# Patient Record
Sex: Male | Born: 1945 | Race: White | Hispanic: No | Marital: Married
Health system: Midwestern US, Academic
[De-identification: ages and names within clinical notes are randomized; demographics above are authoritative.]

## PROBLEM LIST (undated history)

## (undated) DIAGNOSIS — I10 Essential (primary) hypertension: Secondary | ICD-10-CM

## (undated) DIAGNOSIS — E785 Hyperlipidemia, unspecified: Secondary | ICD-10-CM

## (undated) DIAGNOSIS — C801 Malignant (primary) neoplasm, unspecified: Secondary | ICD-10-CM

## (undated) DIAGNOSIS — I255 Ischemic cardiomyopathy: Secondary | ICD-10-CM

## (undated) DIAGNOSIS — I2510 Atherosclerotic heart disease of native coronary artery without angina pectoris: Secondary | ICD-10-CM

## (undated) HISTORY — DX: Atherosclerotic heart disease of native coronary artery without angina pectoris: I25.10

## (undated) HISTORY — DX: Ischemic cardiomyopathy: I25.5

---

## 2014-04-09 ENCOUNTER — Ambulatory Visit: Admit: 2014-04-09 | Discharge: 2014-04-09 | Payer: PRIVATE HEALTH INSURANCE | Attending: Radiation Oncology

## 2014-04-09 DIAGNOSIS — C099 Malignant neoplasm of tonsil, unspecified: Secondary | ICD-10-CM

## 2014-04-09 NOTE — Unmapped (Signed)
Chief Complaint   Patient presents with   ??? 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  Patient states that in late July he developed right cervical lymphadenopathy.  His primary care physician ordered a CT neck without contrast that showed an enlarged right level II neck node measuring 2.7 cm in size that was thought to be infectious.  However, the patient continued to have right sided swelling and was referred to Dr. Tennis Ship, ENT.  He underwent a biopsy in the office of his right tonsil that revealed poorly differentiated squamous cell carcinoma, p16+.  He then underwent PET CT imaging that showed an FDG avid 2x3 cm right level II lymph node, 4 mm right level III lymph node with SUV of 2.4, subcentimeter left level II lymph nodes with max SUV of 2.4 and a 1.6 cm right tonsillar mass with SUV of 11.9 and asymmetric uptake in the left tonsil.  He was referred to Radiation Oncology and ENT Surgeon to discuss treatment options.  He is here today for a second opinion.  He is overall doing well and feels fine without any complaints.  He denies dysphagia, odynophagia, sore throat, weight loss, otalgia, choking, cough, SOB, nausea, vomiting, fever and chills.      Review of Systems   All other systems reviewed and are negative.      Allergies  Review of patient's allergies indicates no known allergies.    Medications  Outpatient Encounter Prescriptions as of 04/09/2014   Medication Sig Dispense Refill   ??? atorvastatin (LIPITOR) 20 MG tablet   0     No facility-administered encounter medications on file as of 04/09/2014.        Histories  He has a past medical history of Cancer.    He has no past surgical history on file.    His family history is not  on file.    He reports that he has never smoked. He does not have any smokeless tobacco history on file. He reports that he drinks alcohol. He reports that he does not use illicit drugs.    Resp. rate 18, height 6' (1.829 m), weight 220 lb (99.791 kg).  Physical Exam   Vitals reviewed.  Constitutional: He is oriented to person, place, and time. He appears well-developed and well-nourished.   HENT:   Head: Normocephalic and atraumatic.   Right Ear: External ear normal.   Left Ear: External ear normal.   Nose: Nose normal.   Mouth/Throat: Oropharynx is clear and moist. No oropharyngeal exudate.   Adentulous, no concerning lesions within OC.  Examination of the oropharynx reveals an ulcerative lesion on the right tonsil that appears to be confined to the tonsil without palpable evidence of invasion onto the base of tongue.   Eyes: Conjunctivae and EOM are normal. Right eye exhibits no discharge. Left eye exhibits no discharge. No scleral icterus.   Neck: Normal range of motion. Neck supple. No rigidity. No edema, no erythema and normal range of motion present. No thyromegaly present.   Palpable, mobile non-fixed right level II enlarged lymph node    Cardiovascular: Normal rate, regular rhythm and intact distal pulses.  Exam reveals no gallop and  no friction rub.    No murmur heard.  Pulmonary/Chest: Effort normal and breath sounds normal. No respiratory distress.   Abdominal: Soft. He exhibits no distension.   Musculoskeletal: Normal range of motion.   Lymphadenopathy:        Head (right side): No submental, no submandibular, no tonsillar, no preauricular, no posterior auricular and no occipital adenopathy present.        Head (left side): No submental, no submandibular, no tonsillar, no preauricular, no posterior auricular and no occipital adenopathy present.     He has cervical adenopathy.        Right cervical: Superficial cervical adenopathy present. No deep cervical and no posterior cervical adenopathy present.        Left cervical: No superficial cervical, no deep cervical and no posterior cervical adenopathy present.     He has no axillary adenopathy.        Right axillary: No pectoral and no lateral adenopathy present.        Left axillary: No pectoral and no lateral adenopathy present.       Right: No supraclavicular adenopathy present.        Left: No supraclavicular adenopathy present.   Neurological: He is alert and oriented to person, place, and time. No cranial nerve deficit.   Skin: Skin is warm and dry. No rash noted. No erythema. No pallor.   Psychiatric: He has a normal mood and affect. His behavior is normal. Judgment and thought content normal.         Diagnostic Studies Reviewed  I personally reviewed the CT neck without contrast from 11/2013 and PET CT from 03/2014    PET CT 03/2014  FDG avid 2x3 cm right level II lymph node, 4 mm right level III lymph node with SUV of 2.4, subcentimeter left level II lymph nodes with max SUV of 2.4 and a 1.6 cm right tonsillar mass with SUV of 11.9 and asymmetric uptake in the left tonsil.     Review of Lab Results  No results found for: WBC, RBC, HGB, HCT, MCV, MCH, MCHC, RDW, PLT, MPV       Assessment  Mr. 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  He is here today for a second opinion regarding treatment of his locally advanced H&N cancer.  We discussed with the patient that his work up incomplete as he needs a quad scope with biopsy to further evaluate the tonsil and surrounding areas.  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  We dicussed these findings with the patient and his diagnosis and staging.  We did discuss definitive treatment with radiation vs chemoRT including the  logistics or radiation treatment planning and delivery and potential short and long term side effects of treatments.  We also discussed definitive surgery, however there is a good likelihood that we would require post-op RT.                   Plan  Pending further work-up, we will plan to see the patient back if he wishes to be treated here.  Patient encouraged to call with questions and/or concerns.          Attending Addendum  I have seen and examined the patient, reviewed pertinent imaging and discussed the plan with my resident. Mr Santistevan has an intermediate stage tonsil cancer. 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  I agree with my resident's assessment and plan.  Armen Pickup, MD    Clinical Stage: T_2 N1__ M0__    Pathologic Stage: T___ N___ M___    Performance Status: (0) Fully active, able to carry on all pre-disease performance without restriction    Feeding tube: No       % of nutritional support: (<50%  Or  >50%)

## 2014-04-11 NOTE — Unmapped (Signed)
Has decided to start treatment. PCP ordered CT scan (not scheduled yet). Told him to bring the disc and report after he has it done.

## 2014-04-17 ENCOUNTER — Ambulatory Visit: Admit: 2014-04-17 | Discharge: 2014-04-17 | Payer: PRIVATE HEALTH INSURANCE

## 2014-04-26 ENCOUNTER — Inpatient Hospital Stay: Admit: 2014-04-26

## 2014-04-26 DIAGNOSIS — C099 Malignant neoplasm of tonsil, unspecified: Secondary | ICD-10-CM

## 2014-05-01 NOTE — Unmapped (Deleted)
Chief Complaint   Patient presents with   ??? 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  Patient states that in late July he developed right cervical lymphadenopathy.  His primary care physician ordered a CT neck without contrast that showed an enlarged right level II neck node measuring 2.7 cm in size that was thought to be infectious.  However, the patient continued to have right sided swelling and was referred to Dr. Horne, ENT.  He underwent a biopsy in the office of his right tonsil that revealed poorly differentiated squamous cell carcinoma, p16+.  He then underwent PET CT imaging that showed an FDG avid 2x3 cm right level II lymph node, 4 mm right level III lymph node with SUV of 2.4, subcentimeter left level II lymph nodes with max SUV of 2.4 and a 1.6 cm right tonsillar mass with SUV of 11.9 and asymmetric uptake in the left tonsil.  He was referred to Radiation Oncology and ENT Surgeon to discuss treatment options.  He is here today for a second opinion.  He is overall doing well and feels fine without any complaints.  He denies dysphagia, odynophagia, sore throat, weight loss, otalgia, choking, cough, SOB, nausea, vomiting, fever and chills.      Review of Systems   All other systems reviewed and are negative.      Allergies  Review of patient's allergies indicates no known allergies.    Medications  Outpatient Encounter Prescriptions as of 04/09/2014   Medication Sig Dispense Refill   ??? atorvastatin (LIPITOR) 20 MG tablet   0     No facility-administered encounter medications on file as of 04/09/2014.        Histories  He has a past medical history of Cancer.    He has no past surgical history on file.    His family history is not  on file.    He reports that he has never smoked. He does not have any smokeless tobacco history on file. He reports that he drinks alcohol. He reports that he does not use illicit drugs.    Resp. rate 18, height 6' (1.829 m), weight 220 lb (99.791 kg).  Physical Exam   Vitals reviewed.  Constitutional: He is oriented to person, place, and time. He appears well-developed and well-nourished.   HENT:   Head: Normocephalic and atraumatic.   Right Ear: External ear normal.   Left Ear: External ear normal.   Nose: Nose normal.   Mouth/Throat: Oropharynx is clear and moist. No oropharyngeal exudate.   Adentulous, no concerning lesions within OC.  Examination of the oropharynx reveals an ulcerative lesion on the right tonsil that appears to be confined to the tonsil without palpable evidence of invasion onto the base of tongue.   Eyes: Conjunctivae and EOM are normal. Right eye exhibits no discharge. Left eye exhibits no discharge. No scleral icterus.   Neck: Normal range of motion. Neck supple. No rigidity. No edema, no erythema and normal range of motion present. No thyromegaly present.   Palpable, mobile non-fixed right level II enlarged lymph node    Cardiovascular: Normal rate, regular rhythm and intact distal pulses.  Exam reveals no gallop and   no friction rub.    No murmur heard.  Pulmonary/Chest: Effort normal and breath sounds normal. No respiratory distress.   Abdominal: Soft. He exhibits no distension.   Musculoskeletal: Normal range of motion.   Lymphadenopathy:        Head (right side): No submental, no submandibular, no tonsillar, no preauricular, no posterior auricular and no occipital adenopathy present.        Head (left side): No submental, no submandibular, no tonsillar, no preauricular, no posterior auricular and no occipital adenopathy present.     He has cervical adenopathy.        Right cervical: Superficial cervical adenopathy present. No deep cervical and no posterior cervical adenopathy present.        Left cervical: No superficial cervical, no deep cervical and no posterior cervical adenopathy present.     He has no axillary adenopathy.        Right axillary: No pectoral and no lateral adenopathy present.        Left axillary: No pectoral and no lateral adenopathy present.       Right: No supraclavicular adenopathy present.        Left: No supraclavicular adenopathy present.   Neurological: He is alert and oriented to person, place, and time. No cranial nerve deficit.   Skin: Skin is warm and dry. No rash noted. No erythema. No pallor.   Psychiatric: He has a normal mood and affect. His behavior is normal. Judgment and thought content normal.         Diagnostic Studies Reviewed  I personally reviewed the CT neck without contrast from 11/2013 and PET CT from 03/2014    PET CT 03/2014  FDG avid 2x3 cm right level II lymph node, 4 mm right level III lymph node with SUV of 2.4, subcentimeter left level II lymph nodes with max SUV of 2.4 and a 1.6 cm right tonsillar mass with SUV of 11.9 and asymmetric uptake in the left tonsil.     Review of Lab Results  No results found for: WBC, RBC, HGB, HCT, MCV, MCH, MCHC, RDW, PLT, MPV       Assessment  Frank. 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  He is here today for a second opinion regarding treatment of his locally advanced H&N cancer.  We discussed with the patient that his work up incomplete as he needs a quad scope with biopsy to further evaluate the tonsil and surrounding areas.  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  We dicussed these findings with the patient and his diagnosis and staging.  We did discuss definitive treatment with radiation vs chemoRT including the  logistics or radiation treatment planning and delivery and potential short and long term side effects of treatments.  We also discussed definitive surgery, however there is a good likelihood that we would require post-op RT.                   Plan  Pending further work-up, we will plan to see the patient back if he wishes to be treated here.  Patient encouraged to call with questions and/or concerns.          Attending Addendum    I have seen and examined the patient, reviewed pertinent imaging and discussed the plan with my resident. Frank Adkins has an intermediate stage tonsil cancer. 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  I agree with my resident's assessment and plan.  Britini Garcilazo J Teyla Skidgel, MD    Clinical Stage: T_2 N1__ M0__    Pathologic Stage: T___ N___ M___    Performance Status: (0) Fully active, able to carry on all pre-disease performance without restriction    Feeding tube: No       % of nutritional support: (<50%  Or  >50%)

## 2014-05-07 NOTE — Unmapped (Signed)
RADIATION ONCOLOGY ON-TREATMENT VISIT    Nursing Assessment:  Appetite: fair   Sleeping: no problems  Nausea: No  Vomiting: No  Diarrhea: No    Fatigue:       - Feeling tired: No      - Napping Frequently: No      - Energy Level: Yes    Pain Level: 0  (Note: Pain level is assessed on a 10 point scale).  Pain Location:   Skin Reactions: none    Allergies:  Review of patient's allergies indicates no known allergies.    Patient Profile:  68 y.o. Not Hispanic or Latino male with tonsil cancer    Current Radiation Dose: 8  Total Prescribed Dose: 70    Physician Note:  Just starting therapoy and doing well.     Review of Systems    Vital Signs:  Blood pressure 151/79, pulse 86, temperature 98.6 ??F (37 ??C), resp. rate 16, weight 224 lb 12.8 oz (101.969 kg).    Physical Exam    No mucositis or dermatitis. Lungs clear.      Assessment  Doing well. Discussed course of therapy toxicities, timing and alleviating factors.                     Plan  Continue radiotherapy as previously prescribed

## 2014-05-14 NOTE — Unmapped (Signed)
RADIATION ONCOLOGY ON-TREATMENT VISIT    Nursing Assessment:  Appetite: good   Sleeping: no problems  Nausea: No  Vomiting: No  Diarrhea: No    Fatigue:       - Feeling tired: No      - Napping Frequently: No      - Energy Level: Yes    Pain Level: 0  (Note: Pain level is assessed on a 10 point scale).  Pain Location:   Skin Reactions: none    Allergies:  Review of patient's allergies indicates no known allergies.    Patient Profile:  68 y.o. Not Hispanic or Latino male with tonsil cancer    Current Radiation Dose: 20  Total Prescribed Dose: 70  Physician Note:  Doing well. Using skin care.     Review of Systems    Vital Signs:  Blood pressure 158/90, pulse 101, temperature 98.2 ??F (36.8 ??C), resp. rate 18, weight 225 lb (102.059 kg).    Physical Exam    Tumor responding . Lungs clear. Skin intact.      Assessment  Doing well. Discussed course of therapy toxicities, timing and alleviating factors.                     Plan  Continue radiotherapy as previously prescribed

## 2014-05-21 MED ORDER — nystatin-hydrocortisone-diphenhydrAMINE
100000 | ORAL | 0.00 refills | 15.50000 days | Status: AC
Start: 2014-05-21 — End: ?

## 2014-05-21 NOTE — Unmapped (Addendum)
RADIATION ONCOLOGY ON-TREATMENT VISIT    Nursing Assessment:  Appetite: fair   Sleeping: no problems  Nausea: No  Vomiting: No  Diarrhea: No    Fatigue:       - Feeling tired: No      - Napping Frequently: No      - Energy Level: Yes    Pain Level: 0  (Note: Pain level is assessed on a 10 point scale).  Pain Location: on and off mild discomfort  Skin Reactions: none    Allergies:  Review of patient's allergies indicates no known allergies.    Patient Profile:  68 y.o.   white  male with tonsil cancer    Current Radiation Dose: 26  Total Prescribed Dose: 70    Resident Note:  Starting to develop some dysgeusia and odynophagia. Weight in the office is down quite a bit today but he weighs himself at home and has been stable.     Physician Note:  Mucosittis noted, on right   Review of Systems   All other systems reviewed and are negative.      Vital Signs:  There were no vitals taken for this visit.    Physical Exam   Nursing note and vitals reviewed.  Constitutional: He appears well-developed and well-nourished. No distress.   HENT:   Head: Normocephalic and atraumatic.   Mouth/Throat: No oropharyngeal exudate.   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 Tolerating radiotherapy well. Early mucositis noted.                   Plan  Continue therapy as planned  Rx for Mile's mix given

## 2014-05-24 ENCOUNTER — Inpatient Hospital Stay: Admit: 2014-05-24 | Attending: Radiation Oncology

## 2014-05-28 MED ORDER — nystatin (MYCOSTATIN) 100,000 unit/mL suspension
100000 | Freq: Four times a day (QID) | ORAL | Status: AC
Start: 2014-05-28 — End: ?

## 2014-05-28 NOTE — Unmapped (Signed)
RADIATION ONCOLOGY ON-TREATMENT VISIT    Nursing Assessment:  Appetite: fair   Sleeping: no problems  Nausea: No  Vomiting: No  Diarrhea: No    Fatigue:       - Feeling tired: No      - Napping Frequently: No      - Energy Level: Yes    Pain Level: 0  (Note: Pain level is assessed on a 10 point scale).  Pain Location: some discomfort  Skin Reactions: pink or red    Allergies:  Review of patient's allergies indicates no known allergies.    Patient Profile:  69 y.o. Not Hispanic or Latino male with tonsil cancer    Current Radiation Dose: 34  Total Prescribed Dose: 70    Resident Note:  Patient doing well.  Endorses loss of taste as well as dry mouth.  Taking most of nutrition with boost due to pain from swallowing.  No dysphagia.  No fevers, chills, cp, sob, n/v.      Physician Note:  Doing well. Using skin care.     Review of Systems  Per HPI    Vital Signs:  Blood pressure 137/81, pulse 85, resp. rate 18, weight 222 lb (100.699 kg).    Physical Exam    Appears well  Erythema of right neck  OP/OC: thrush noted on soft palate, mild erythema,       Assessment  Patient is undergoing RT for OP SCC, currently doing well with mild thrush, will prescribed nystatin and recommend RT.                    Plan  Continue RT   Nystatin swish and swallow

## 2014-06-04 NOTE — Unmapped (Signed)
RADIATION ONCOLOGY ON-TREATMENT VISIT    Nursing Assessment:  Appetite: fair   Sleeping: no problems  Nausea: No  Vomiting: No  Diarrhea: No    Fatigue:       - Feeling tired: No      - Napping Frequently: No      - Energy Level: Yes    Pain Level: 0  (Note: Pain level is assessed on a 10 point scale).  Pain Location: some discomfort  Skin Reactions: none    Allergies:  Review of patient's allergies indicates no known allergies.    Patient Profile:  69 y.o. Not Hispanic or Latino male with tonsil cancer    Current Radiation Dose: 44  Total Prescribed Dose: 70    Resident Note:  Patients weight is stable, small spot of thrush on soft palate, improved from last week. Endorses lack of taste and dry mouth.  Denies dysphagia, neck pain, weight loss, new adenopathy.    Physician Note:  Doing well. Mild thrush    Review of Systems  Per HPI    Vital Signs:  Temperature 97.7 ??F (36.5 ??C), resp. rate 16, weight 220 lb (99.791 kg).    Physical Exam    OC clear except for small spot of thrush on right lateral soft palate  Neck erythema on right side  Non-tender with palpation      Assessment  Patient undergoing RT for tonsillar cancer, doing well, recommend he continue RT.  Ginette Pitman is clearing up nicely.  Continue nystatin.                    Plan  Continue RT   Continue nystatin

## 2014-06-12 NOTE — Unmapped (Signed)
RADIATION ONCOLOGY ON-TREATMENT VISIT    Nursing Assessment:  Appetite: fair   Sleeping: no problems  Nausea: No  Vomiting: No  Diarrhea: No    Fatigue:       - Feeling tired: Yes      - Napping Frequently: No      - Energy Level: Yes    Pain Level: 0  (Note: Pain level is assessed on a 10 point scale).  Pain Location: some discomfort  Skin Reactions: none    Allergies:  Review of patient's allergies indicates no known allergies.    Patient Profile:  69 y.o. Not Hispanic or Latino male with tonsil cancer    Current Radiation Dose: 54  Total Prescribed Dose: 70    Physician Note:  Patients weight is stable, thrush is improved.  Patient notes that everything tastes like cardboard and mouth is dry, but he is still trying to get in 2000 calories daily. Denies dysphagia, neck pain, weight loss, new adenopathy.    Review of Systems Positive as per HPI above.  Comprehensive review of systems is otherwise negative.    Vital Signs:  Filed Vitals:    06/12/14 1300   BP: 133/81   Pulse: 88   Temp: 97.6 ??F (36.4 ??C)   Resp: 16     Physical Exam    Aox3. Conversing appropriately.  OC clear.  Patchy mucositis along ipsilateral buccal mucosa.  No thrush.  Neck: Mild erythema on the right side with no desquamation. Non-tender with palpation. No cervical lymphadenopathy.    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 Tolerating radiotherapy as expected.    Plan  Patient setup reviewed at time of treatment today.  Continue with radiation treatment plan as prescribed. Treatment plan, upcoming potential side effects, and prophylactic measures discussed. Patient encouraged to try Boost if not getting enough calories and to use a humidifier at the bedside at night to assist with xerostomia.

## 2014-06-18 NOTE — Unmapped (Signed)
RADIATION ONCOLOGY ON-TREATMENT VISIT    Nursing Assessment:  Appetite: fair   Sleeping: no problems  Nausea: No  Vomiting: No  Diarrhea: No    Fatigue:       - Feeling tired: Yes      - Napping Frequently: No      - Energy Level: Yes    Pain Level: 0  (Note: Pain level is assessed on a 10 point scale).  Pain Location: some discomfort  Skin Reactions: none    Allergies:  Review of patient's allergies indicates no known allergies.    Patient Profile:  69 y.o. Not Hispanic or Latino male with tonsil cancer    Current Radiation Dose: 62  Total Prescribed Dose: 70      Physician Note:  Doing well. Using skin cand throat care.     Review of Systems    Vital Signs:  Resp. rate 16, weight 219 lb (99.338 kg).    Physical Exam       deramtitis. And mucositis in the field. Mild dry secretions.   Assessment  Doing well. Discussed course of therapy toxicities, timing and alleviating factors.                     Plan  Continue radiotherapy as previously prescribed

## 2014-07-09 NOTE — Unmapped (Signed)
RadOnc Final Therapy Note       Frank Adkins is a 69 y.o., Not Hispanic or Latino, male with Cancer of tonsil, faucial    Primary site: Pharynx - Oropharynx (Right)    Staging method: AJCC 7th Edition    Clinical: Stage III (T2, N1, M0) - Signed by Andree Coss, MD on 04/26/2014    Summary: Stage III (T2, N1, M0) cancer of the right tonsil.     Attending Physician: Fransisco Beau    Location of Treatment: [facility]    Site of Radiation/Area of Interest: right tonsil and neck    Course #: 1    Treatment Data    Type of Radiation: Definitive    Was a Systemic Therapy agent used in conjunction with Radiotherapy? No      If Yes, which agent was used?  na    Radiation Modality:  IMRT      Beam Energy: 6 MV    Image Guidance: Daily    Radiotherapy:    Start Date: 12/ 09/, 2015     End Date: 01/ 29/, 2015     RT Dose per Fraction (Gy): 2Gy / 1.6Gy RT Total Fraction Count: 35 RT Total Dose (Gy): 70Gy / 56Gy  Elapsed Days: 51    Treatment Course: Frank Adkins completed radiotherapy with no unforseen issues or complications.       Treatment Interruptions and Modifications    Was radiotherapy interrupted?  No    Reason Treatment Ended: Treatment completed per protocol    Miscellaneous Data    Feeding tube present at completion of therapy?  No.     RTOG Acute Radiation Morbidity Scoring Criteria    Skin:  2 - Tender or bright erythema, patchy moist desquamation/moderate edema  Mucous Membrane:  2 - Patchy mucositis which may produce an inflammatory serosanguinitis discharge/ may experience moderate pain requiring analgesia   Ear:  1 - Mild external otitis with erythema, pruritis, secondary to dry desquamation not requiring medication. Audiogram unchanged from baseline   Salivary Gland:  2 - Moderate to complete dryness/ thick, sticky saliva/ markedly altered taste   Pharynx & Esophagus:  1 - Mild dysphagia or odynophagia/ may require topical anesthetic or non-narcotic analgesics/ may require soft diet   Larynx:  1 - Mild  or intermittent hoarseness/cough not requiring antitussive/ erythema of mucosa     Follow Up: 3 weeks

## 2014-07-23 ENCOUNTER — Ambulatory Visit: Admit: 2014-07-23 | Discharge: 2014-07-23 | Payer: PRIVATE HEALTH INSURANCE | Attending: Radiation Oncology

## 2014-07-23 DIAGNOSIS — C760 Malignant neoplasm of head, face and neck: Secondary | ICD-10-CM

## 2014-07-23 NOTE — Unmapped (Signed)
Chief Complaint   Patient presents with   ??? Follow-up       Diagnosis  Cancer of tonsil, faucial    Primary site: Pharynx - Oropharynx (Right)    Staging method: AJCC 7th Edition    Clinical: Stage III (T2, N1, M0) - Signed by Andree Coss, MD on 04/26/2014    Summary: Stage III (T2, N1, M0)    Previous Treatments  06/22/2014: 70 Gy in 35 fx       History of Present Illness  Patient is a 69yo male with the above dx who presents for f/u.  Currently he is doing very well.  He denies fevers, chills, cp, sob, headaches vision changes.  Appetite is very good but unable to taste the food.  He denies dysphagia, odynphagia.  He denies neck swelling, adenopathy, ulceration.  Minimal to no xerostomia.Marland Kitchen  He is still seeing his ENT in [place].      Weight appers stable.        Review of Systems    Allergies  Review of patient's allergies indicates no known allergies.    Medications  Outpatient Encounter Prescriptions as of 2/67/2016   Medication Sig Dispense Refill   ??? atorvastatin (LIPITOR) 20 MG tablet   0   ??? irbesartan (AVAPRO) 150 MG tablet   0   ??? nystatin (MYCOSTATIN) 100,000 unit/mL suspension Take 5 mLs (500,000 Units total) by mouth 4 times a day. 473 mL 3   ??? nystatin-hydrocortisone-diphenhydrAMINE Nystatin 8,000,000IU, Doxycycline 500mg , Hydrocortisone 80mg & benedryl elixir to constitute  Swish and swallow 5 mls QID 480 mL 1     No facility-administered encounter medications on file as of 2/67/2016.        Histories  He has a past medical history of Cancer.    He has no past surgical history on file.    His family history is not on file.    He reports that he has never smoked. He does not have any smokeless tobacco history on file. He reports that he drinks alcohol. He reports that he does not use illicit drugs.       Blood pressure 131/76, pulse 86, temperature 97.4 ??F (36.3 ??C), resp. rate 16, weight 223 lb (101.152 kg).  Physical Exam   Constitutional: He is oriented to person, place, and time. He appears  well-developed.   HENT:   Head: Normocephalic.   OC: Clear with dentures removed.  NO thrush or mucositis present  OP: clear, slight erythema. Difficulty visualizing the larynx 2/2 to his gag response.     Eyes: Pupils are equal, round, and reactive to light.   Neck: Normal range of motion.   Cardiovascular: Normal rate.    Pulmonary/Chest: Effort normal.   Abdominal: Soft.   Musculoskeletal: Normal range of motion.   Neurological: He is alert and oriented to person, place, and time.   Psychiatric: He has a normal mood and affect. Thought content normal.         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  Overall, doing very well with minimal to no symptoms.  He should obtain a PET/CT roughly 10-12 wks out from radiation to assess the response to therapy.  NED at this time.  Plan  F/u in 2 mos w/ PET/CT    Attending Addendum  I have seen and examined the patient, reviewed pertinent imaging and discussed the plan with my resident. Mr lofton is healing well.  I agree with my resident's assessment and plan.  Armen Pickup, MD

## 2014-09-17 ENCOUNTER — Ambulatory Visit: Admit: 2014-09-17 | Discharge: 2014-09-17 | Payer: PRIVATE HEALTH INSURANCE | Attending: Radiation Oncology

## 2014-09-17 DIAGNOSIS — C099 Malignant neoplasm of tonsil, unspecified: Secondary | ICD-10-CM

## 2014-09-17 NOTE — Unmapped (Signed)
Chief Complaint   Patient presents with   ??? Follow-up       Diagnosis  Cancer of tonsil, faucial    Primary site: Pharynx - Oropharynx (Right)    Staging method: AJCC 7th Edition    Clinical: Stage III (T2, N1, M0) - Signed by Andree Coss, MD on 04/26/2014    Summary: Stage III (T2, N1, M0)    Previous Treatments  06/22/2014: 70 Gy in 35 fx       History of Present Illness  Frank Adkins returns for routine scheduled follow-up. Per PET/CT report from OSH (disk not available) completed on 09/14/2014, complete response in the right tonsillar fossa, with reduction in size and enhancement of right level 2 LN, but still mildly elevated some FDG avidity (physiologic at 3.5, node at 4.1, down from ~12). Per report, there was some concern for residual malignancy. He continues to do very well clinically and his taste has improved over the last 2 months; minimal xerostomia.  He denies SOB/CP/F/C/N/V.  He denies dysphagia, odynphagia. Mild swelling under chin. Denies adenopathy, continues to FU with ENT in [place], [place]. No other complaints at this time.        Review of Systems  12 Point Review of Systems completed and negative except per HPI.       Allergies  Review of patient's allergies indicates no known allergies.    Medications  Outpatient Encounter Prescriptions as of 09/17/2014   Medication Sig Dispense Refill   ??? atorvastatin (LIPITOR) 20 MG tablet   0   ??? irbesartan (AVAPRO) 150 MG tablet   0   ??? nystatin (MYCOSTATIN) 100,000 unit/mL suspension Take 5 mLs (500,000 Units total) by mouth 4 times a day. 473 mL 3   ??? nystatin-hydrocortisone-diphenhydrAMINE Nystatin 8,000,000IU, Doxycycline 500mg , Hydrocortisone 80mg & benedryl elixir to constitute  Swish and swallow 5 mls QID 480 mL 1     No facility-administered encounter medications on file as of 09/17/2014.        Histories  He has a past medical history of Cancer.    He has no past surgical history on file.    His family history is not on file.    He reports that he has  never smoked. He does not have any smokeless tobacco history on file. He reports that he drinks alcohol. He reports that he does not use illicit drugs.       Blood pressure 141/84, pulse 83, temperature 98.5 ??F (36.9 ??C), resp. rate 18, weight 217 lb (98.431 kg).  Physical Exam   Constitutional: He is oriented to person, place, and time. He appears well-developed.   HENT:   Head: Normocephalic.   OC: no visible or palpable lesions appreciated  OP: clear, erythema over right tonsillar fossa has resolved, could not evaluate larynx/HPX 2/2 gag reflex    Eyes: Pupils are equal, round, and reactive to light.   Neck: Normal range of motion.   Cardiovascular: Normal rate.    Pulmonary/Chest: Effort normal.   Abdominal: Soft.   Musculoskeletal: Normal range of motion.   Neurological: He is alert and oriented to person, place, and time.   Psychiatric: He has a normal mood and affect. Thought content normal.         Diagnostic Studies Reviewed  PET/CT from 09/14/2014 not available for review, report through Care Everywhere available.         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  Overall, doing very well with minimal to no symptoms. Given PET/CT stating that SUV of 3.5 is physiologic uptake, SUV of 4.1 does not appear to be significantly increased. Scans need to be evaluated for direct comparison.                        Plan  Obtain PET/CT from [facility] and compare with prior scan. RTC in 2 months.       Attending Addendum  I have seen and examined the patient, reviewed pertinent imaging and discussed the plan with my resident. Frank Adkins is doing well. He has a small residual palpable node in the neck. It is softer and less bulky than previously.  I agree with my resident's assessment and plan.  Armen Pickup, MD    Pain Assessment: Mild    Is the patient currently smoking? No  If  yes, how much (#cigs/day):    Feeding tube: No  Date Discontinued:      % of nutritional support: (<50%  Or  >50%)    General Dental Health:Normal    Dental Caries: na    Disease Status: No evidence of disease    Treatment-related Toxicity:    Peripheral sensory neuropathy: None  Neuralgia: None  Hearing impairment: Mild (generally no intervention)  Laryngeal edema: Mild (generally no intervention)  Swallowing dysfunction: Mild (generally no intervention)  Xerostomia: Mild (generally no intervention)  Avascular necrosis: None  Soft tissue necrosis/fibrosis (head): None  Soft tissue necrosis/fibrosis (neck): None  Chronic kidney disease: None  Skin hyperpigmentation: Mild (generally no intervention)  Skin induration: Mild (generally no intervention)  Osteonecrosis of jaw: None

## 2014-09-26 NOTE — Unmapped (Signed)
Patient called to requesting results on recent PET scan, ask to please call at home [phone].

## 2014-10-01 ENCOUNTER — Inpatient Hospital Stay: Admit: 2014-10-01 | Attending: Radiation Oncology

## 2014-11-19 ENCOUNTER — Encounter: Payer: PRIVATE HEALTH INSURANCE | Attending: Radiation Oncology

## 2014-12-19 ENCOUNTER — Ambulatory Visit: Admit: 2014-12-19 | Discharge: 2014-12-19 | Payer: PRIVATE HEALTH INSURANCE | Attending: Radiation Oncology

## 2014-12-19 DIAGNOSIS — C099 Malignant neoplasm of tonsil, unspecified: Secondary | ICD-10-CM

## 2014-12-19 NOTE — Unmapped (Signed)
Chief Complaint   Patient presents with   ??? Follow-up       Diagnosis  Cancer of tonsil, faucial    Primary site: Pharynx - Oropharynx (Right)    Staging method: AJCC 7th Edition    Clinical: Stage III (T2, N1, M0) - Signed by Andree Coss, MD on 04/26/2014    Summary: Stage III (T2, N1, M0)    Previous Treatments  06/22/2014: 70 Gy in 35 fx     History of Present Illness  The patient is a 69yo male seen today for follow up. Last seen in 08/2014, PET/CT was obtained at this time which showed complete response to the right tonsil but slight avidity in the right level 2 LN.  10/2014, the patient went for a right sided neck dissection at [facility] which came back negative for malignancy.  Since this time, the incision is healing well.  He denies odynophagia, dysphagia, headaches, vision changes, cough, cp, sob. His taste is 90% normal and xerostomia is 70%. His appetite and energy are very good. He will be seeing his ENT on Friday.  He is currently living in [place].          Review of Systems  Per HPI    Allergies  Review of patient's allergies indicates no known allergies.    Medications  Outpatient Encounter Prescriptions as of 12/19/2014   Medication Sig Dispense Refill   ??? atorvastatin (LIPITOR) 20 MG tablet   0   ??? irbesartan (AVAPRO) 150 MG tablet   0   ??? nystatin (MYCOSTATIN) 100,000 unit/mL suspension Take 5 mLs (500,000 Units total) by mouth 4 times a day. 473 mL 3   ??? nystatin-hydrocortisone-diphenhydrAMINE Nystatin 8,000,000IU, Doxycycline 500mg , Hydrocortisone 80mg & benedryl elixir to constitute  Swish and swallow 5 mls QID 480 mL 1   ??? omeprazole (PRILOSEC) 40 MG capsule TAKE 1 CAPSULE BY MOUTH ONCE DAILY 1 HOUR BEFORE MEALS  0     No facility-administered encounter medications on file as of 12/19/2014.        Histories  He has a past medical history of Cancer.    He has no past surgical history on file.    His family history is not on file.    He reports that he has never smoked. He does not have  any smokeless tobacco history on file. He reports that he drinks alcohol. He reports that he does not use illicit drugs.       Blood pressure 173/93, pulse 74, temperature 97.9 ??F (36.6 ??C), resp. rate 16, weight 219 lb 6.4 oz (99.519 kg).  Physical Exam   Vitals reviewed.  Constitutional: He is oriented to person, place, and time. He appears well-developed and well-nourished. No distress.   HENT:   Head: Normocephalic and atraumatic.   Right Ear: External ear normal.   Left Ear: External ear normal.   Nose: Nose normal.   Mouth/Throat: Oropharynx is clear and moist. No oropharyngeal exudate.   OC: no ulcerations or nodules.  Upper/Lower dentures  OP: erythema of OP with telangiectasias but no masses or lesions  Indirect mirror exam difficult 2/2 to gag reflex but good cord mobility noted  Neck: right neck firm skin and well healed incision.  No new adenopathy drainage's.    Eyes: Conjunctivae are normal. Right eye exhibits no discharge. Left eye exhibits no discharge. No scleral icterus.   Neck: Normal range of motion. Neck supple.   Cardiovascular: Normal rate, regular rhythm, normal heart sounds and intact  distal pulses.  Exam reveals no gallop and no friction rub.    No murmur heard.  Pulmonary/Chest: Effort normal. He has no wheezes.   Abdominal: Soft.   Musculoskeletal: Normal range of motion.   Lymphadenopathy:        Head (right side): No submental, no submandibular, no tonsillar, no preauricular, no posterior auricular and no occipital adenopathy present.        Head (left side): No submental, no submandibular, no tonsillar, no preauricular, no posterior auricular and no occipital adenopathy present.     He has no cervical adenopathy.        Right cervical: No superficial cervical, no deep cervical and no posterior cervical adenopathy present.       Left cervical: No superficial cervical, no deep cervical and no posterior cervical adenopathy present.     He has no axillary adenopathy.        Right axillary:  No pectoral and no lateral adenopathy present.        Left axillary: No pectoral and no lateral adenopathy present.       Right: No supraclavicular adenopathy present.        Left: No supraclavicular adenopathy present.   Neurological: He is alert and oriented to person, place, and time.   Skin: Skin is warm and dry. No rash noted. He is not diaphoretic. No erythema. No pallor.   Psychiatric: He has a normal mood and affect. Thought content normal.         Diagnostic Studies Reviewed      Review of Lab Results  No results found for: WBC, RBC, HGB, HCT, MCV, MCH, MCHC, RDW, PLT, MPV        Assessment  The patient is a 69yo male with Stage III right OP cancer s/p definitive RT and right selective neck dissection.  He is clinically doing very well with NED.  He will be seeing ENT on Friday for evaluation.  We recommend he come back in 3 months for evaluation.  Will obtain TSH/T4 at 1 year.                      Plan  Follow up in 3 months         Attending Addendum  I have seen and examined the patient, reviewed pertinent imaging and discussed the plan with my resident. Mr Walston is doing well. The pathology from the neck dissection was negative for viable tumor. He is doing well at this time but is still healing from the course of therapy overall.  I agree with my resident's assessment and plan.  Armen Pickup, MD      Pain Assessment: None    Is the patient currently smoking? No  If yes, how much (#cigs/day):    Feeding tube: No  Date Discontinued:      % of nutritional support: (<50%  Or  >50%)    General Dental Health:Edentulous    Dental Caries: na    Disease Status: No evidence of disease    Treatment-related Toxicity:    Peripheral sensory neuropathy: None  Neuralgia: Mild (generally no intervention)  Hearing impairment: None  Laryngeal edema: None  Swallowing dysfunction: Mild (generally no intervention)  Xerostomia: Mild (generally no intervention)  Avascular necrosis: None  Soft tissue necrosis/fibrosis  (head): None  Soft tissue necrosis/fibrosis (neck): Mild (generally no intervention)  Chronic kidney disease: None  Skin hyperpigmentation: Mild (generally no intervention)  Skin induration: Mild (generally no intervention)  Osteonecrosis of jaw: None

## 2015-04-24 ENCOUNTER — Encounter: Payer: PRIVATE HEALTH INSURANCE | Attending: Radiation Oncology

## 2015-05-01 ENCOUNTER — Encounter: Payer: PRIVATE HEALTH INSURANCE | Attending: Radiation Oncology

## 2015-10-22 ENCOUNTER — Other Ambulatory Visit: Payer: Self-pay | Admitting: Otolaryngology

## 2015-10-22 ENCOUNTER — Ambulatory Visit
Admission: RE | Admit: 2015-10-22 | Discharge: 2015-10-22 | Disposition: A | Discharge status: Home / Self Care | Payer: BLUE CROSS/BLUE SHIELD | Source: Ambulatory Visit | Attending: Otolaryngology | Admitting: Otolaryngology

## 2015-10-22 DIAGNOSIS — Z09 Encounter for follow-up examination after completed treatment for conditions other than malignant neoplasm: Secondary | ICD-10-CM

## 2016-02-04 ENCOUNTER — Ambulatory Visit
Admission: RE | Admit: 2016-02-04 | Discharge: 2016-02-04 | Disposition: A | Discharge status: Home / Self Care | Payer: BLUE CROSS/BLUE SHIELD | Source: Ambulatory Visit | Attending: Family Medicine | Admitting: Family Medicine

## 2016-02-04 ENCOUNTER — Other Ambulatory Visit: Payer: Self-pay | Admitting: Family Medicine

## 2016-02-04 DIAGNOSIS — R509 Fever, unspecified: Secondary | ICD-10-CM

## 2016-03-05 ENCOUNTER — Other Ambulatory Visit: Payer: Self-pay | Admitting: Family Medicine

## 2016-03-05 ENCOUNTER — Ambulatory Visit
Admission: RE | Admit: 2016-03-05 | Discharge: 2016-03-05 | Disposition: A | Discharge status: Home / Self Care | Payer: BLUE CROSS/BLUE SHIELD | Source: Ambulatory Visit | Attending: Family Medicine | Admitting: Family Medicine

## 2016-03-05 DIAGNOSIS — J189 Pneumonia, unspecified organism: Secondary | ICD-10-CM

## 2017-10-11 ENCOUNTER — Other Ambulatory Visit: Payer: Self-pay | Admitting: Family Medicine

## 2017-10-11 ENCOUNTER — Ambulatory Visit
Admission: RE | Admit: 2017-10-11 | Discharge: 2017-10-11 | Disposition: A | Discharge status: Home / Self Care | Payer: BLUE CROSS/BLUE SHIELD | Source: Ambulatory Visit | Attending: Family Medicine | Admitting: Family Medicine

## 2017-10-11 DIAGNOSIS — M25562 Pain in left knee: Secondary | ICD-10-CM

## 2019-06-24 ENCOUNTER — Ambulatory Visit: Payer: BLUE CROSS/BLUE SHIELD

## 2019-06-29 ENCOUNTER — Ambulatory Visit: Payer: BLUE CROSS/BLUE SHIELD

## 2019-07-01 ENCOUNTER — Ambulatory Visit: Payer: BC Managed Care – PPO | Attending: Internal Medicine

## 2019-07-01 DIAGNOSIS — Z23 Encounter for immunization: Secondary | ICD-10-CM | POA: Insufficient documentation

## 2019-07-01 NOTE — Progress Notes (Signed)
   Covid-19 Vaccination Clinic  Name:  Dywane Colston    MRN: [id number] DOB: [birth year]  07/01/2019  Mr. Reisinger was observed post Covid-19 immunization for 15 minutes without incidence. He was provided with Vaccine Information Sheet and instruction to access the V-Safe system.   Mr. Mirkin was instructed to call 911 with any severe reactions post vaccine: Marland Kitchen Difficulty breathing  . Swelling of your face and throat  . A fast heartbeat  . A bad rash all over your body  . Dizziness and weakness    Immunizations Administered    Name Date Dose VIS Date Route   Pfizer COVID-19 Vaccine 07/01/2019  6:05 PM 0.3 mL 05/05/2019 Intramuscular   Manufacturer: [place]   Lot: [id number]   [place]: [id number]

## 2019-07-26 ENCOUNTER — Ambulatory Visit: Payer: BC Managed Care – PPO | Attending: Internal Medicine

## 2019-07-26 DIAGNOSIS — Z23 Encounter for immunization: Secondary | ICD-10-CM | POA: Insufficient documentation

## 2019-07-26 NOTE — Progress Notes (Signed)
   Covid-19 Vaccination Clinic  Name:  Jerome Wilkerson    MRN: [id number] DOB: 1946/05/09  07/26/2019  Mr. [name] was observed post Covid-19 immunization for 15 minutes without incident. He was provided with Vaccine Information Sheet and instruction to access the V-Safe system.   Mr. [name] was instructed to call 911 with any severe reactions post vaccine: Marland Kitchen Difficulty breathing  . Swelling of face and throat  . A fast heartbeat  . A bad rash all over body  . Dizziness and weakness   Immunizations Administered    Name Date Dose VIS Date Route   Pfizer COVID-19 Vaccine 07/26/2019  3:22 PM 0.3 mL 05/05/2019 Intramuscular   Manufacturer: [place]   Lot: [id number]   [place]: [id number]

## 2020-08-22 ENCOUNTER — Inpatient Hospital Stay (HOSPITAL_COMMUNITY)
Admission: EM | Admit: 2020-08-22 | Discharge: 2020-08-24 | DRG: 281 | Disposition: A | Discharge status: Home / Self Care | Payer: BC Managed Care – PPO | Attending: Internal Medicine | Admitting: Internal Medicine

## 2020-08-22 ENCOUNTER — Encounter (HOSPITAL_COMMUNITY): Payer: Self-pay

## 2020-08-22 ENCOUNTER — Other Ambulatory Visit: Payer: Self-pay

## 2020-08-22 ENCOUNTER — Emergency Department (HOSPITAL_COMMUNITY): Payer: BC Managed Care – PPO

## 2020-08-22 DIAGNOSIS — N179 Acute kidney failure, unspecified: Secondary | ICD-10-CM | POA: Diagnosis present

## 2020-08-22 DIAGNOSIS — R079 Chest pain, unspecified: Secondary | ICD-10-CM

## 2020-08-22 DIAGNOSIS — I959 Hypotension, unspecified: Secondary | ICD-10-CM | POA: Diagnosis present

## 2020-08-22 DIAGNOSIS — M25511 Pain in right shoulder: Secondary | ICD-10-CM | POA: Diagnosis not present

## 2020-08-22 DIAGNOSIS — Z79899 Other long term (current) drug therapy: Secondary | ICD-10-CM

## 2020-08-22 DIAGNOSIS — I249 Acute ischemic heart disease, unspecified: Secondary | ICD-10-CM | POA: Diagnosis present

## 2020-08-22 DIAGNOSIS — I2510 Atherosclerotic heart disease of native coronary artery without angina pectoris: Secondary | ICD-10-CM | POA: Diagnosis present

## 2020-08-22 DIAGNOSIS — I214 Non-ST elevation (NSTEMI) myocardial infarction: Secondary | ICD-10-CM | POA: Diagnosis not present

## 2020-08-22 DIAGNOSIS — Z888 Allergy status to other drugs, medicaments and biological substances status: Secondary | ICD-10-CM

## 2020-08-22 DIAGNOSIS — M79601 Pain in right arm: Secondary | ICD-10-CM

## 2020-08-22 DIAGNOSIS — E669 Obesity, unspecified: Secondary | ICD-10-CM | POA: Diagnosis present

## 2020-08-22 DIAGNOSIS — Z87891 Personal history of nicotine dependence: Secondary | ICD-10-CM

## 2020-08-22 DIAGNOSIS — K219 Gastro-esophageal reflux disease without esophagitis: Secondary | ICD-10-CM | POA: Diagnosis present

## 2020-08-22 DIAGNOSIS — M25519 Pain in unspecified shoulder: Secondary | ICD-10-CM

## 2020-08-22 DIAGNOSIS — E785 Hyperlipidemia, unspecified: Secondary | ICD-10-CM | POA: Diagnosis present

## 2020-08-22 DIAGNOSIS — Z6831 Body mass index (BMI) 31.0-31.9, adult: Secondary | ICD-10-CM

## 2020-08-22 DIAGNOSIS — I10 Essential (primary) hypertension: Secondary | ICD-10-CM | POA: Diagnosis present

## 2020-08-22 DIAGNOSIS — Z20822 Contact with and (suspected) exposure to covid-19: Secondary | ICD-10-CM | POA: Diagnosis present

## 2020-08-22 HISTORY — DX: Malignant (primary) neoplasm, unspecified: C80.1

## 2020-08-22 HISTORY — DX: Hyperlipidemia, unspecified: E78.5

## 2020-08-22 HISTORY — DX: Essential (primary) hypertension: I10

## 2020-08-22 LAB — CBC
HCT: 45.5 % (ref 39.0–52.0)
Hemoglobin: 15.7 g/dL (ref 13.0–17.0)
MCH: 31 pg (ref 26.0–34.0)
MCHC: 34.5 g/dL (ref 30.0–36.0)
MCV: 89.7 fL (ref 80.0–100.0)
Platelets: 166 10*3/uL (ref 150–400)
RBC: 5.07 MIL/uL (ref 4.22–5.81)
RDW: 13.7 % (ref 11.5–15.5)
WBC: 7.5 10*3/uL (ref 4.0–10.5)
nRBC: 0 % (ref 0.0–0.2)

## 2020-08-22 MED ORDER — ASPIRIN 81 MG PO CHEW
324.0000 mg | CHEWABLE_TABLET | Freq: Once | ORAL | Status: AC
  Administered 2020-08-22: 324 mg via ORAL
  Filled 2020-08-22: qty 4

## 2020-08-22 MED ORDER — ONDANSETRON HCL 4 MG/2ML IJ SOLN
4.0000 mg | Freq: Once | INTRAMUSCULAR | Status: AC
  Administered 2020-08-22: 4 mg via INTRAVENOUS
  Filled 2020-08-22: qty 2

## 2020-08-22 MED ORDER — MORPHINE SULFATE (PF) 4 MG/ML IV SOLN
4.0000 mg | Freq: Once | INTRAVENOUS | Status: AC
  Administered 2020-08-22: 4 mg via INTRAVENOUS
  Filled 2020-08-22: qty 1

## 2020-08-22 NOTE — ED Triage Notes (Addendum)
Pt c/o right arm pain worse in the shoulder and lower chest pain radiating from right to left. Arm pain began earlier today associated with cp tonight.

## 2020-08-22 NOTE — ED Provider Notes (Signed)
[facility] DEPT Provider Note   CSN: [id number] Arrival date & time: 08/22/20  2209     History Chief Complaint  Patient presents with  . Chest Pain    Jerome Wilkerson is a 75 y.o. male.  HPI   Patient presents to the ED with complaints of right arm pain and chest pain.  Patient states this morning he started having pain in his right arm going all the way down into his wrist.  He has not had any issues with numbness or weakness.  Patient also started having trouble with pain in his chest that started later in the evening.  It was in the right chest moving to the left chest.  After the started S1 the patient decided to come to the ED.  He does not have a history of heart disease.  He has not had any issues with fevers or chills.  No coughing.  Past Medical History:  Diagnosis Date  . Cancer ([place])   . Hyperlipidemia   . Hypertension     There are no problems to display for this patient.   History reviewed. No pertinent surgical history.     No family history on file.  Social History   Tobacco Use  . Smoking status: Former Research scientist (life sciences)  . Smokeless tobacco: Never Used  Substance Use Topics  . Alcohol use: Not Currently  . Drug use: Never    Home Medications Prior to Admission medications   Not on File    Allergies    Other  Review of Systems   Review of Systems  All other systems reviewed and are negative.   Physical Exam Updated Vital Signs BP (!) 160/85   Pulse 80   Temp 97.8 F (36.6 C) (Oral)   Resp 20   Ht 1.829 m (6')   Wt 104.3 kg   SpO2 93%   BMI 31.19 kg/m   Physical Exam Vitals and nursing note reviewed.  Constitutional:      General: He is not in acute distress.    Appearance: He is well-developed.  HENT:     Head: Normocephalic and atraumatic.     Right Ear: External ear normal.     Left Ear: External ear normal.  Eyes:     General: No scleral icterus.       Right eye: No discharge.        Left eye:  No discharge.     Conjunctiva/sclera: Conjunctivae normal.  Neck:     Trachea: No tracheal deviation.     Comments: Tenderness palpation right trapezius and paraspinal muscles of the neck Cardiovascular:     Rate and Rhythm: Normal rate and regular rhythm.  Pulmonary:     Effort: Pulmonary effort is normal. No respiratory distress.     Breath sounds: Normal breath sounds. No stridor. No wheezing or rales.  Abdominal:     General: Bowel sounds are normal. There is no distension.     Palpations: Abdomen is soft.     Tenderness: There is no abdominal tenderness. There is no guarding or rebound.  Musculoskeletal:        General: No tenderness.     Cervical back: Neck supple.     Comments: Extremities are warm and well perfused, normal pulses, no cyanosis  Skin:    General: Skin is warm and dry.     Findings: No rash.  Neurological:     Mental Status: He is alert.     Cranial Nerves:  No cranial nerve deficit (no facial droop, extraocular movements intact, no slurred speech).     Sensory: No sensory deficit.     Motor: No abnormal muscle tone or seizure activity.     Coordination: Coordination normal.     ED Results / Procedures / Treatments   Labs (all labs ordered are listed, but only abnormal results are displayed) Labs Reviewed  CBC  BASIC METABOLIC PANEL  TROPONIN I (HIGH SENSITIVITY)    EKG EKG Interpretation  Date/Time:  Thursday August 22 2020 22:15:23 EDT Ventricular Rate:  78 PR Interval:  161 QRS Duration: 99 QT Interval:  379 QTC Calculation: 432 R Axis:   60 Text Interpretation: Sinus rhythm Ventricular premature complex Abnormal inferior Q waves Minimal ST depression, inferior leads ** Poor data quality, interpretation may be adversely affected Confirmed by Dorie Rank [phone]) on 08/22/2020 10:33:16 PM   Radiology DG Chest 2 View  Result Date: 08/22/2020 CLINICAL DATA:  Chest pain EXAM: CHEST - 2 VIEW COMPARISON:  03/05/2016 FINDINGS: Cardiac shadow is  within normal limits. Calcified granuloma is again noted in the left apex. No acute infiltrate or effusion is seen. No bony abnormality is noted. IMPRESSION: No active cardiopulmonary disease. Electronically Signed   By: Inez Catalina M.D.   On: 08/22/2020 22:37    Procedures Procedures   Medications Ordered in ED Medications  morphine 4 MG/ML injection 4 mg (4 mg Intravenous Given 08/22/20 2307)  ondansetron (ZOFRAN) injection 4 mg (4 mg Intravenous Given 08/22/20 2307)  aspirin chewable tablet 324 mg (324 mg Oral Given 08/22/20 2308)    ED Course  I have reviewed the triage vital signs and the nursing notes.  Pertinent labs & imaging results that were available during my care of the patient were reviewed by me and considered in my medical decision making (see chart for details).    MDM Rules/Calculators/A&P                          Patient presented to ED with complaints of arm pain and chest pain.  He is having primarily pain in his right arm.  He does have tenderness.  Could be musculoskeletal in nature but he is now also having chest pain.  Plan on laboratory test serial troponins.  Care will be turned over to Dr. Dina Rich. Final Clinical Impression(s) / ED Diagnoses Final diagnoses:  Chest pain, unspecified type  Pain of right upper extremity      Dorie Rank, MD 08/22/20 2340

## 2020-08-22 NOTE — ED Notes (Addendum)
Patient transported to X-ray via triage chair.

## 2020-08-23 ENCOUNTER — Encounter (HOSPITAL_COMMUNITY)
Admission: EM | Disposition: A | Discharge status: Home / Self Care | Payer: Self-pay | Source: Home / Self Care | Attending: Internal Medicine

## 2020-08-23 ENCOUNTER — Other Ambulatory Visit (HOSPITAL_COMMUNITY): Payer: Self-pay

## 2020-08-23 ENCOUNTER — Other Ambulatory Visit: Payer: Self-pay

## 2020-08-23 ENCOUNTER — Observation Stay (HOSPITAL_COMMUNITY): Payer: BC Managed Care – PPO

## 2020-08-23 ENCOUNTER — Encounter (HOSPITAL_COMMUNITY): Payer: Self-pay | Admitting: Internal Medicine

## 2020-08-23 DIAGNOSIS — R079 Chest pain, unspecified: Secondary | ICD-10-CM | POA: Diagnosis not present

## 2020-08-23 DIAGNOSIS — M25511 Pain in right shoulder: Secondary | ICD-10-CM | POA: Diagnosis not present

## 2020-08-23 DIAGNOSIS — I249 Acute ischemic heart disease, unspecified: Secondary | ICD-10-CM

## 2020-08-23 DIAGNOSIS — Z6831 Body mass index (BMI) 31.0-31.9, adult: Secondary | ICD-10-CM | POA: Diagnosis not present

## 2020-08-23 DIAGNOSIS — K219 Gastro-esophageal reflux disease without esophagitis: Secondary | ICD-10-CM | POA: Diagnosis present

## 2020-08-23 DIAGNOSIS — I214 Non-ST elevation (NSTEMI) myocardial infarction: Secondary | ICD-10-CM | POA: Diagnosis present

## 2020-08-23 DIAGNOSIS — E785 Hyperlipidemia, unspecified: Secondary | ICD-10-CM | POA: Diagnosis present

## 2020-08-23 DIAGNOSIS — I10 Essential (primary) hypertension: Secondary | ICD-10-CM

## 2020-08-23 DIAGNOSIS — E7800 Pure hypercholesterolemia, unspecified: Secondary | ICD-10-CM | POA: Diagnosis not present

## 2020-08-23 DIAGNOSIS — I2510 Atherosclerotic heart disease of native coronary artery without angina pectoris: Secondary | ICD-10-CM | POA: Diagnosis present

## 2020-08-23 DIAGNOSIS — N179 Acute kidney failure, unspecified: Secondary | ICD-10-CM | POA: Diagnosis present

## 2020-08-23 DIAGNOSIS — Z888 Allergy status to other drugs, medicaments and biological substances status: Secondary | ICD-10-CM | POA: Diagnosis not present

## 2020-08-23 DIAGNOSIS — Z79899 Other long term (current) drug therapy: Secondary | ICD-10-CM | POA: Diagnosis not present

## 2020-08-23 DIAGNOSIS — I959 Hypotension, unspecified: Secondary | ICD-10-CM | POA: Diagnosis present

## 2020-08-23 DIAGNOSIS — Z20822 Contact with and (suspected) exposure to covid-19: Secondary | ICD-10-CM | POA: Diagnosis present

## 2020-08-23 DIAGNOSIS — Z87891 Personal history of nicotine dependence: Secondary | ICD-10-CM | POA: Diagnosis not present

## 2020-08-23 DIAGNOSIS — E669 Obesity, unspecified: Secondary | ICD-10-CM | POA: Diagnosis present

## 2020-08-23 DIAGNOSIS — M25519 Pain in unspecified shoulder: Secondary | ICD-10-CM | POA: Diagnosis present

## 2020-08-23 HISTORY — PX: LEFT HEART CATH AND CORONARY ANGIOGRAPHY: CATH118249

## 2020-08-23 LAB — CBC
HCT: 38.7 % — ABNORMAL LOW (ref 39.0–52.0)
HCT: 44.4 % (ref 39.0–52.0)
Hemoglobin: 13.2 g/dL (ref 13.0–17.0)
Hemoglobin: 15.1 g/dL (ref 13.0–17.0)
MCH: 31.4 pg (ref 26.0–34.0)
MCH: 30.9 pg (ref 26.0–34.0)
MCHC: 34.1 g/dL (ref 30.0–36.0)
MCHC: 34 g/dL (ref 30.0–36.0)
MCV: 91.9 fL (ref 80.0–100.0)
MCV: 91 fL (ref 80.0–100.0)
Platelets: 142 10*3/uL — ABNORMAL LOW (ref 150–400)
Platelets: 183 10*3/uL (ref 150–400)
RBC: 4.21 MIL/uL — ABNORMAL LOW (ref 4.22–5.81)
RBC: 4.88 MIL/uL (ref 4.22–5.81)
RDW: 13.8 % (ref 11.5–15.5)
RDW: 13.7 % (ref 11.5–15.5)
WBC: 9 10*3/uL (ref 4.0–10.5)
WBC: 11 10*3/uL — ABNORMAL HIGH (ref 4.0–10.5)
nRBC: 0 % (ref 0.0–0.2)
nRBC: 0 % (ref 0.0–0.2)

## 2020-08-23 LAB — BASIC METABOLIC PANEL
Anion gap: 9 (ref 5–15)
Anion gap: 8 (ref 5–15)
BUN: 23 mg/dL (ref 8–23)
BUN: 19 mg/dL (ref 8–23)
CO2: 27 mmol/L (ref 22–32)
CO2: 26 mmol/L (ref 22–32)
Calcium: 9.5 mg/dL (ref 8.9–10.3)
Calcium: 9.1 mg/dL (ref 8.9–10.3)
Chloride: 109 mmol/L (ref 98–111)
Chloride: 108 mmol/L (ref 98–111)
Creatinine, Ser: 1.46 mg/dL — ABNORMAL HIGH (ref 0.61–1.24)
Creatinine, Ser: 1.25 mg/dL — ABNORMAL HIGH (ref 0.61–1.24)
GFR, Estimated: 51 mL/min — ABNORMAL LOW (ref >60)
GFR, Estimated: >60 mL/min (ref >60)
Glucose, Bld: 124 mg/dL — ABNORMAL HIGH (ref 70–99)
Glucose, Bld: 103 mg/dL — ABNORMAL HIGH (ref 70–99)
Potassium: 3.8 mmol/L (ref 3.5–5.1)
Potassium: 3.9 mmol/L (ref 3.5–5.1)
Sodium: 145 mmol/L (ref 135–145)
Sodium: 142 mmol/L (ref 135–145)

## 2020-08-23 LAB — RESP PANEL BY RT-PCR (FLU A&B, COVID) ARPGX2
Influenza A by PCR: NEGATIVE
Influenza B by PCR: NEGATIVE
SARS Coronavirus 2 by RT PCR: NEGATIVE

## 2020-08-23 LAB — HEPARIN LEVEL (UNFRACTIONATED): Heparin Unfractionated: 0.48 IU/mL (ref 0.30–0.70)

## 2020-08-23 LAB — CREATININE, SERUM
Creatinine, Ser: 1.17 mg/dL (ref 0.61–1.24)
GFR, Estimated: >60 mL/min (ref >60)

## 2020-08-23 LAB — LIPID PANEL
Cholesterol: 155 mg/dL (ref 0–200)
HDL: 39 mg/dL — ABNORMAL LOW (ref >40)
LDL Cholesterol: 101 mg/dL — ABNORMAL HIGH (ref 0–99)
Total CHOL/HDL Ratio: 4 RATIO
Triglycerides: 73 mg/dL (ref <150)
VLDL: 15 mg/dL (ref 0–40)

## 2020-08-23 LAB — TROPONIN I (HIGH SENSITIVITY)
Troponin I (High Sensitivity): 1013 ng/L (ref <18)
Troponin I (High Sensitivity): 852 ng/L (ref <18)

## 2020-08-23 SURGERY — LEFT HEART CATH AND CORONARY ANGIOGRAPHY
Anesthesia: LOCAL

## 2020-08-23 MED ORDER — SODIUM CHLORIDE 0.9% FLUSH
3.0000 mL | Freq: Two times a day (BID) | INTRAVENOUS | Status: DC
  Administered 2020-08-23 – 2020-08-24 (×2): 3 mL via INTRAVENOUS

## 2020-08-23 MED ORDER — OXYCODONE HCL 5 MG PO TABS
5.0000 mg | ORAL_TABLET | ORAL | Status: DC | PRN
  Administered 2020-08-23: 5 mg via ORAL
  Filled 2020-08-23: qty 1

## 2020-08-23 MED ORDER — ISOSORBIDE MONONITRATE ER 30 MG PO TB24
30.0000 mg | ORAL_TABLET | Freq: Every day | ORAL | Status: DC
  Administered 2020-08-23 – 2020-08-24 (×2): 30 mg via ORAL
  Filled 2020-08-23 (×2): qty 1

## 2020-08-23 MED ORDER — METOPROLOL TARTRATE 12.5 MG HALF TABLET
12.5000 mg | ORAL_TABLET | Freq: Two times a day (BID) | ORAL | Status: DC
  Administered 2020-08-23: 12.5 mg via ORAL
  Filled 2020-08-23: qty 1

## 2020-08-23 MED ORDER — CLOPIDOGREL BISULFATE 75 MG PO TABS
75.0000 mg | ORAL_TABLET | Freq: Every day | ORAL | Status: DC
  Administered 2020-08-23 – 2020-08-24 (×2): 75 mg via ORAL
  Filled 2020-08-23 (×2): qty 1

## 2020-08-23 MED ORDER — HEPARIN (PORCINE) IN NACL 1000-0.9 UT/500ML-% IV SOLN
INTRAVENOUS | Status: AC
  Filled 2020-08-23: qty 1000

## 2020-08-23 MED ORDER — MIDAZOLAM HCL 2 MG/2ML IJ SOLN
INTRAMUSCULAR | Status: DC | PRN
  Administered 2020-08-23: 2 mg via INTRAVENOUS

## 2020-08-23 MED ORDER — ENOXAPARIN SODIUM 40 MG/0.4ML ~~LOC~~ SOLN
40.0000 mg | SUBCUTANEOUS | Status: DC
  Filled 2020-08-23: qty 0.4

## 2020-08-23 MED ORDER — VERAPAMIL HCL 2.5 MG/ML IV SOLN
INTRAVENOUS | Status: AC
  Filled 2020-08-23: qty 2

## 2020-08-23 MED ORDER — HEPARIN (PORCINE) 25000 UT/250ML-% IV SOLN
1200.0000 [IU]/h | INTRAVENOUS | Status: DC
  Administered 2020-08-23: 1200 [IU]/h via INTRAVENOUS
  Filled 2020-08-23: qty 250

## 2020-08-23 MED ORDER — FENTANYL CITRATE (PF) 100 MCG/2ML IJ SOLN
INTRAMUSCULAR | Status: AC
  Filled 2020-08-23: qty 2

## 2020-08-23 MED ORDER — ATORVASTATIN CALCIUM 80 MG PO TABS
80.0000 mg | ORAL_TABLET | Freq: Every day | ORAL | Status: DC
  Administered 2020-08-23 – 2020-08-24 (×2): 80 mg via ORAL
  Filled 2020-08-23: qty 1

## 2020-08-23 MED ORDER — PANTOPRAZOLE SODIUM 40 MG PO TBEC
40.0000 mg | DELAYED_RELEASE_TABLET | Freq: Every day | ORAL | Status: DC
  Administered 2020-08-23 – 2020-08-24 (×2): 40 mg via ORAL
  Filled 2020-08-23 (×2): qty 1

## 2020-08-23 MED ORDER — HEPARIN (PORCINE) IN NACL 1000-0.9 UT/500ML-% IV SOLN
INTRAVENOUS | Status: DC | PRN
  Administered 2020-08-23 (×2): 500 mL

## 2020-08-23 MED ORDER — ONDANSETRON HCL 4 MG/2ML IJ SOLN: 4.0000 mg | Freq: Four times a day (QID) | INTRAMUSCULAR | Status: DC | PRN

## 2020-08-23 MED ORDER — ASPIRIN EC 81 MG PO TBEC
81.0000 mg | DELAYED_RELEASE_TABLET | Freq: Every day | ORAL | Status: DC
  Administered 2020-08-23 – 2020-08-24 (×2): 81 mg via ORAL
  Filled 2020-08-23 (×2): qty 1

## 2020-08-23 MED ORDER — HEPARIN BOLUS VIA INFUSION
4000.0000 [IU] | Freq: Once | INTRAVENOUS | Status: AC
  Administered 2020-08-23: 4000 [IU] via INTRAVENOUS
  Filled 2020-08-23: qty 4000

## 2020-08-23 MED ORDER — METOPROLOL TARTRATE 25 MG PO TABS
25.0000 mg | ORAL_TABLET | Freq: Two times a day (BID) | ORAL | Status: DC
  Administered 2020-08-23 – 2020-08-24 (×2): 25 mg via ORAL
  Filled 2020-08-23 (×2): qty 1

## 2020-08-23 MED ORDER — SODIUM CHLORIDE 0.9 % IV SOLN: 250.0000 mL | INTRAVENOUS | Status: DC | PRN

## 2020-08-23 MED ORDER — HEPARIN SODIUM (PORCINE) 1000 UNIT/ML IJ SOLN
INTRAMUSCULAR | Status: DC | PRN
  Administered 2020-08-23: 5000 [IU] via INTRAVENOUS

## 2020-08-23 MED ORDER — NITROGLYCERIN 2 % TD OINT
1.0000 [in_us] | TOPICAL_OINTMENT | Freq: Once | TRANSDERMAL | Status: AC
  Administered 2020-08-23: 1 [in_us] via TOPICAL
  Filled 2020-08-23: qty 1

## 2020-08-23 MED ORDER — ACETAMINOPHEN 325 MG PO TABS
650.0000 mg | ORAL_TABLET | ORAL | Status: DC | PRN
  Administered 2020-08-23: 650 mg via ORAL
  Filled 2020-08-23: qty 2

## 2020-08-23 MED ORDER — ATORVASTATIN CALCIUM 10 MG PO TABS
20.0000 mg | ORAL_TABLET | Freq: Every day | ORAL | Status: DC
  Filled 2020-08-23: qty 2

## 2020-08-23 MED ORDER — FENTANYL CITRATE (PF) 100 MCG/2ML IJ SOLN
INTRAMUSCULAR | Status: DC | PRN
  Administered 2020-08-23: 25 ug via INTRAVENOUS

## 2020-08-23 MED ORDER — MORPHINE SULFATE (PF) 2 MG/ML IV SOLN
1.0000 mg | INTRAVENOUS | Status: DC | PRN
  Administered 2020-08-23 (×2): 1 mg via INTRAVENOUS
  Filled 2020-08-23 (×2): qty 1

## 2020-08-23 MED ORDER — NITROGLYCERIN 2 % TD OINT: 1.0000 [in_us] | TOPICAL_OINTMENT | Freq: Once | TRANSDERMAL | Status: DC

## 2020-08-23 MED ORDER — IOHEXOL 350 MG/ML SOLN
INTRAVENOUS | Status: DC | PRN
  Administered 2020-08-23: 60 mL

## 2020-08-23 MED ORDER — HEPARIN SODIUM (PORCINE) 1000 UNIT/ML IJ SOLN
INTRAMUSCULAR | Status: AC
  Filled 2020-08-23: qty 1

## 2020-08-23 MED ORDER — SODIUM CHLORIDE 0.9 % IV SOLN: Freq: Once | INTRAVENOUS | Status: AC

## 2020-08-23 MED ORDER — LIDOCAINE 5 % EX PTCH
1.0000 | MEDICATED_PATCH | CUTANEOUS | Status: DC
  Administered 2020-08-23 – 2020-08-24 (×2): 1 via TRANSDERMAL
  Filled 2020-08-23 (×2): qty 1

## 2020-08-23 MED ORDER — SODIUM CHLORIDE 0.9 % IV BOLUS
500.0000 mL | Freq: Once | INTRAVENOUS | Status: AC
  Administered 2020-08-23: 500 mL via INTRAVENOUS

## 2020-08-23 MED ORDER — SODIUM CHLORIDE 0.9% FLUSH: 3.0000 mL | INTRAVENOUS | Status: DC | PRN

## 2020-08-23 MED ORDER — MIDAZOLAM HCL 2 MG/2ML IJ SOLN
INTRAMUSCULAR | Status: AC
  Filled 2020-08-23: qty 2

## 2020-08-23 MED ORDER — SODIUM CHLORIDE 0.9% FLUSH
3.0000 mL | Freq: Two times a day (BID) | INTRAVENOUS | Status: DC
  Administered 2020-08-24: 3 mL via INTRAVENOUS

## 2020-08-23 MED ORDER — FENTANYL CITRATE (PF) 100 MCG/2ML IJ SOLN
50.0000 ug | Freq: Once | INTRAMUSCULAR | Status: AC
  Administered 2020-08-23: 50 ug via INTRAVENOUS
  Filled 2020-08-23: qty 2

## 2020-08-23 MED ORDER — LIDOCAINE HCL (PF) 1 % IJ SOLN
INTRAMUSCULAR | Status: AC
  Filled 2020-08-23: qty 30

## 2020-08-23 MED ORDER — LIDOCAINE HCL (PF) 1 % IJ SOLN
INTRAMUSCULAR | Status: DC | PRN
  Administered 2020-08-23: 2 mL

## 2020-08-23 MED ORDER — SODIUM CHLORIDE 0.9 % WEIGHT BASED INFUSION
1.0000 mL/kg/h | INTRAVENOUS | Status: AC
  Administered 2020-08-23: 1 mL/kg/h via INTRAVENOUS

## 2020-08-23 MED ORDER — VERAPAMIL HCL 2.5 MG/ML IV SOLN
INTRAVENOUS | Status: DC | PRN
  Administered 2020-08-23: 10 mL via INTRA_ARTERIAL

## 2020-08-23 SURGICAL SUPPLY — 11 items
CATH 5FR JL3.5 JR4 ANG PIG MP (CATHETERS) ×1 IMPLANT
CATH LAUNCHER 5F RADR (CATHETERS) IMPLANT
CATHETER LAUNCHER 5F RADR (CATHETERS) ×2
DEVICE RAD COMP TR BAND LRG (VASCULAR PRODUCTS) ×1 IMPLANT
GLIDESHEATH SLEND SS 6F .021 (SHEATH) ×1 IMPLANT
GUIDEWIRE INQWIRE 1.5J.035X260 (WIRE) IMPLANT
INQWIRE 1.5J .035X260CM (WIRE) ×2
KIT HEART LEFT (KITS) ×2 IMPLANT
PACK CARDIAC CATHETERIZATION (CUSTOM PROCEDURE TRAY) ×2 IMPLANT
TRANSDUCER W/STOPCOCK (MISCELLANEOUS) ×2 IMPLANT
TUBING CIL FLEX 10 FLL-RA (TUBING) ×2 IMPLANT

## 2020-08-23 NOTE — Progress Notes (Signed)
Patient suddenly c/o nausea, lightheadedness, dizziness and diaphoretic. BP dropped to 64/44 and HR 58. Nitroglycerin Ointment removed. EDP informed. NS bolus given.

## 2020-08-23 NOTE — ED Notes (Signed)
Pt feel much better. BP 122/73 HR 70. EDP informed. Will continue to monitor.

## 2020-08-23 NOTE — Progress Notes (Incomplete)
Received patient from [facility] via [place].  Pt alert and oriented X4, skin warm and dry , resp even and unlabored, denies any chest discomfort at this time.  IVF of NS infusing and Heparin 1200 units via pump in the right AC.  Pt placed on bedside monitor, consent signed and waiting for cat procedure.  Call bell in place.

## 2020-08-23 NOTE — H&P (Signed)
History and Physical    Jerome Wilkerson MIW:[id number] DOB: 11-14-1945 DOA: 08/22/2020  PCP: Jonathon Jordan, MD Patient coming from: Home  Chief Complaint: Chest pain  HPI: Jerome Wilkerson is a 75 y.o. male with medical history significant of hypertension, hyperlipidemia, obesity (BMI 31.19), GERD presented to the ED with complaints of chest and right arm pain.  In the ED, became hypotensive after nitroglycerin ointment, blood pressure improved after fluid bolus.  High-sensitivity troponin 1013 > 852.  EKG showing inferior Q waves, no prior tracing for comparison.  Screening Covid test negative.  Chest x-ray negative for acute finding. ED physician discussed the case with on-call cardiologist who requested transfer to [facility], cardiology service will consult.  Patient was given aspirin 324 mg and started on IV heparin.  Patient states he went to basketball practice 3 days ago.  For the past 2 days he is having pain in his right shoulder which goes down his entire arm.  He is able to move his arm without any difficulty but pain is worse when he sleeps on his right side.  No associated numbness or tingling.  Denies neck pain.  States last night as he was trying to get ready for bed he felt pressure and tightness all across his chest and felt nauseous.  States he normally never gets chest pain and this was something new which made him come into the ED to be evaluated.  Denies history of heart disease.  States his chest pain has now resolved but he continues to have pain in his right shoulder.  Review of Systems:  All systems reviewed and apart from history of presenting illness, are negative.  Past Medical History:  Diagnosis Date  . Cancer ([place])   . Hyperlipidemia   . Hypertension     History reviewed. No pertinent surgical history.   reports that he has quit smoking. He has never used smokeless tobacco. He reports previous alcohol use. He reports that he does not use  drugs.  Allergies  Allergen Reactions  . Other Other (See Comments)    History reviewed. No pertinent family history.  Prior to Admission medications   Medication Sig Start Date End Date Taking? Authorizing Provider  atorvastatin (LIPITOR) 20 MG tablet Take 20 mg by mouth daily. 08/09/20  Yes [provider]  cholecalciferol (VITAMIN D3) 25 MCG (1000 UNIT) tablet Take 1,000 Units by mouth daily.   Yes [provider]  irbesartan-hydrochlorothiazide (AVALIDE) 300-12.5 MG tablet Take 1 tablet by mouth daily. 08/09/20  Yes [provider]  Multiple Vitamins-Minerals (CENTRUM SILVER 50+MEN) TABS Take 1 tablet by mouth daily.   Yes [provider]  omeprazole (PRILOSEC) 40 MG capsule Take 40 mg by mouth daily. 05/20/20  Yes [provider]    Physical Exam: Vitals:   08/23/20 0330 08/23/20 0400 08/23/20 0430 08/23/20 0500  BP: (!) 141/80 (!) 154/85 (!) 144/85 (!) 148/75  Pulse: 75 77 74 78  Resp: 18 14 19 15   Temp:      TempSrc:      SpO2: 96% 95% 95% 96%  Weight:      Height:        Physical Exam Constitutional:      General: He is not in acute distress. HENT:     Head: Normocephalic and atraumatic.  Eyes:     Extraocular Movements: Extraocular movements intact.     Conjunctiva/sclera: Conjunctivae normal.  Cardiovascular:     Rate and Rhythm: Normal rate and regular rhythm.  Pulses: Normal pulses.  Pulmonary:     Effort: Pulmonary effort is normal. No respiratory distress.     Breath sounds: Normal breath sounds. No wheezing or rales.  Abdominal:     General: Bowel sounds are normal. There is no distension.     Palpations: Abdomen is soft.     Tenderness: There is no abdominal tenderness.  Musculoskeletal:        General: Tenderness present.     Cervical back: Normal range of motion and neck supple.     Comments: Right shoulder tender to palpation with normal active range of motion.  No obvious deformity.  Skin:     General: Skin is warm and dry.  Neurological:     General: No focal deficit present.     Mental Status: He is alert and oriented to person, place, and time.     Labs on Admission: I have personally reviewed following labs and imaging studies  CBC: Recent Labs  Lab 08/22/20 2304 08/23/20 0532  WBC 7.5 9.0  HGB 15.7 13.2  HCT 45.5 38.7*  MCV 89.7 91.9  PLT 166 099*   Basic Metabolic Panel: Recent Labs  Lab 08/22/20 2304  NA 145  K 3.8  CL 109  CO2 27  GLUCOSE 124*  BUN 23  CREATININE 1.46*  CALCIUM 9.5   GFR: Estimated Creatinine Clearance: 57.1 mL/min (A) (by C-G formula based on SCr of 1.46 mg/dL (H)). Liver Function Tests: No results for input(s): AST, ALT, ALKPHOS, BILITOT, PROT, ALBUMIN in the last 168 hours. No results for input(s): LIPASE, AMYLASE in the last 168 hours. No results for input(s): AMMONIA in the last 168 hours. Coagulation Profile: No results for input(s): INR, PROTIME in the last 168 hours. Cardiac Enzymes: No results for input(s): CKTOTAL, CKMB, CKMBINDEX, TROPONINI in the last 168 hours. BNP (last 3 results) No results for input(s): PROBNP in the last 8760 hours. HbA1C: No results for input(s): HGBA1C in the last 72 hours. CBG: No results for input(s): GLUCAP in the last 168 hours. Lipid Profile: No results for input(s): CHOL, HDL, LDLCALC, TRIG, CHOLHDL, LDLDIRECT in the last 72 hours. Thyroid Function Tests: No results for input(s): TSH, T4TOTAL, FREET4, T3FREE, THYROIDAB in the last 72 hours. Anemia Panel: No results for input(s): VITAMINB12, FOLATE, FERRITIN, TIBC, IRON, RETICCTPCT in the last 72 hours. Urine analysis: No results found for: COLORURINE, APPEARANCEUR, [place], [place], [place], [place], [place], [place], [place], [place], NITRITE, LEUKOCYTESUR  Radiological Exams on Admission: DG Chest 2 View  Result Date: 08/22/2020 CLINICAL DATA:  Chest pain EXAM: CHEST - 2 VIEW COMPARISON:  03/05/2016 FINDINGS:  Cardiac shadow is within normal limits. Calcified granuloma is again noted in the left apex. No acute infiltrate or effusion is seen. No bony abnormality is noted. IMPRESSION: No active cardiopulmonary disease. Electronically Signed   By: Inez Catalina M.D.   On: 08/22/2020 22:37    EKG: Independently reviewed.  Sinus rhythm, inferior Q waves.  No prior tracing for comparison.  Assessment/Plan Principal Problem:   ACS (acute coronary syndrome) (HCC) Active Problems:   Right shoulder pain   HTN (hypertension)   HLD (hyperlipidemia)   GERD (gastroesophageal reflux disease)   ACS/ NSTEMI: Has risk factors for CAD.  High-sensitivity troponin 1013 > 852.  EKG showing inferior Q waves, no prior tracing for comparison.  Chest pain has now resolved.  Cardiology requested admission at [facility] and their service will consult. -Cardiac monitoring.  Patient was given full dose aspirin in the ED.  Continue IV heparin.  EKG as needed for recurrence of chest pain.  Right shoulder pain: Appears to be musculoskeletal as it started after he played basketball 3 days ago.  No fall or trauma reported.  Shoulder tender to palpation on exam but has normal active range of motion.  No obvious deformity.  No right arm/hand paresthesias and motor strength intact.  No neck pain. -Pain management.  Obtain plain film x-ray.  Hypertension -Hold antihypertensives at this time given intermittent hypotension after nitroglycerin which improved after fluid bolus.  Hyperlipidemia -Continue home Lipitor 20 mg daily, check lipid panel.  Obesity (BMI 31.19) -Encourage lifestyle modifications such as healthy diet and exercise.  GERD -Continue PPI  DVT prophylaxis: Heparin Code Status: Patient wishes to be full code. Family Communication: No family available at this time. Disposition Plan: Status is: Observation  The patient remains OBS appropriate and will d/c before 2 midnights.  Dispo: The patient is from: Home               Anticipated d/c is to: Home              Patient currently is not medically stable to d/c.   Difficult to place patient No  Level of care: Progressive care  The medical decision making on this patient was of high complexity and the patient is at high risk for clinical deterioration, therefore this is a level 3 visit.  Shela Leff MD [facility]  If 7PM-7AM, please contact night-coverage [email or web address]  08/23/2020, 6:59 AM

## 2020-08-23 NOTE — ED Notes (Signed)
Report given to [facility] 

## 2020-08-23 NOTE — ED Notes (Signed)
Called report to brain at cath lab at [facility]

## 2020-08-23 NOTE — Progress Notes (Signed)
[facility] night shift.  The nursing staff reports that the patient is having right shoulder pain and is declining the IV morphine.  Oxycodone 5 mg p.o. every 4 hours as needed x2 doses ordered.  I will defer further pain management to the rounding team tomorrow morning.  Tennis Must, MD

## 2020-08-23 NOTE — ED Notes (Signed)
Patient ambulatory to restroom with no distress

## 2020-08-23 NOTE — Progress Notes (Signed)
ANTICOAGULATION CONSULT NOTE - follow up  Pharmacy Consult for Heparin Indication: chest pain/ACS  Allergies  Allergen Reactions  . Other Other (See Comments)    Patient Measurements: Height: 6' (182.9 cm) Weight: 104.3 kg (230 lb) IBW/kg (Calculated) : 77.6 Heparin dosing weight: 100kg  Vital Signs: Temp: 97.8 F (36.6 C) (03/31 2216) Temp Source: Oral (03/31 2216) BP: 171/98 (04/01 0900) Pulse Rate: 75 (04/01 0900)  Labs: Recent Labs    08/22/20 2304 08/23/20 0027 08/23/20 0532 08/23/20 0846 08/23/20 0927  HGB 15.7  --  13.2  --   --   HCT 45.5  --  38.7*  --   --   PLT 166  --  142*  --   --   HEPARINUNFRC  --   --   --  0.48  --   CREATININE 1.46*  --   --   --  1.25*  TROPONINIHS 1,013* 852*  --   --   --     Estimated Creatinine Clearance: 66.7 mL/min (A) (by C-G formula based on SCr of 1.25 mg/dL (H)).   Medical History: Past Medical History:  Diagnosis Date  . Cancer ([place])   . Hyperlipidemia   . Hypertension     Medications:  Infusions:  . heparin 1,200 Units/hr (08/23/20 0103)    Assessment: 75 yo M presents with chest pain.  Elevated troponin, no ST elevation.  No hx CAD.  Baseline CBC WNL, no bleeding noted. Not on anticoagulation PTA.    Heparin level therapeutic on current IV heparin rate of 1200 units/hr  NO reported bleeding  Noted plan for patient to go cath lab today at [facility]  Goal of Therapy:  Heparin level 0.3-0.7 units/ml Monitor platelets by anticoagulation protocol: Yes   Plan:   Continue IV heparin at current rate of 1200 units/hr  Recheck heparin level in 8 hours if patient still on IV heparin at that point  Daily heparin level & CBC while on heparin  Kara Mead PharmD, BCPS 08/23/2020,10:00 AM

## 2020-08-23 NOTE — H&P (View-Only) (Signed)
Cardiology Consultation:   Patient ID: Jerome Wilkerson; [id number]; 03-Nov-1945   Admit date: 08/22/2020 Date of Consult: 08/23/2020  Primary Care Provider: Jonathon Jordan, MD Primary Cardiologist: New to [facility]  Patient Profile:   Jerome Wilkerson is a 75 y.o. male with a hx of HTN, HLD, obesity and GERD who is being seen today for the evaluation of NSTEMI at the request of Dr. Marlowe Sax.  History of Present Illness:   Jerome Wilkerson is a 75yo M with a hx as stated above who presented to [facility] 08/22/20 with a 2 day hx of what initially began as right shoulder pain which progressed to anterior chest tightness. 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 On Thursday night, approximately 1 hour after attempting to go to sleep, he bagn having chest tightness that worsened the longer that he laid in bed. He denies associated diaphoresis but recalls having some nausea. He is currently chest pain free. He has no prior personal or family hx of CAD. He denies hx of tobacco or alcohol use. He denies recent SOB, LE edema, orthopnea, dizziness, or syncope. No recent illness with fever or chills. He is active at baseline and denies prior hx of chest pain.   In the ED, EKG with NSR and inferior Q waves with no old tracing for comparison. HsT initially found to be 1013>>852. Creatinine at 1.46 with no comparison. Hb stable at 13.2. He was given ASA 324mg  and IV Heparin. CXR with no acute cardiopulmonary findings.   Past Medical History:  Diagnosis Date  . Cancer ([place])   . Hyperlipidemia   . Hypertension     History reviewed. No pertinent surgical history.   Prior to Admission medications   Medication Sig Start Date End Date Taking? Authorizing Provider  atorvastatin (LIPITOR) 20 MG tablet Take 20 mg by mouth daily. 08/09/20  Yes [provider]   cholecalciferol (VITAMIN D3) 25 MCG (1000 UNIT) tablet Take 1,000 Units by mouth daily.   Yes [provider]  irbesartan-hydrochlorothiazide (AVALIDE) 300-12.5 MG tablet Take 1 tablet by mouth daily. 08/09/20  Yes [provider]  Multiple Vitamins-Minerals (CENTRUM SILVER 50+MEN) TABS Take 1 tablet by mouth daily.   Yes [provider]  omeprazole (PRILOSEC) 40 MG capsule Take 40 mg by mouth daily. 05/20/20  Yes [provider]    Inpatient Medications: Scheduled Meds: . atorvastatin  20 mg Oral Daily  . lidocaine  1 patch Transdermal Q24H  . pantoprazole  40 mg Oral Daily   Continuous Infusions: . heparin 1,200 Units/hr (08/23/20 0103)   PRN Meds: acetaminophen, morphine injection, ondansetron (ZOFRAN) IV  Allergies:    Allergies  Allergen Reactions  . Other Other (See Comments)    Social History:   Social History   Socioeconomic History  . Marital status: Married    Spouse name: Not on file  . Number of children: Not on file  . Years of education: Not on file  . Highest education level: Not on file  Occupational History  . Not on file  Tobacco Use  . Smoking status: Former Research scientist (life sciences)  . Smokeless tobacco: Never Used  Substance and Sexual Activity  . Alcohol use: Not Currently  . Drug use: Never  . Sexual activity: Not on file  Other Topics Concern  . Not on file  Social History  Narrative  . Not on file   Social Determinants of Health   Financial Resource Strain: Not on file  Food Insecurity: Not on file  Transportation Needs: Not on file  Physical Activity: Not on file  Stress: Not on file  Social Connections: Not on file  Intimate Partner Violence: Not on file    Family History:   History reviewed. No pertinent family history. Family Status:  No family status information on file.    ROS:  Please see the history of present illness.  All other ROS reviewed and negative.     Physical Exam/Data:   Vitals:    08/23/20 0530 08/23/20 0600 08/23/20 0630 08/23/20 0700  BP: 134/73 132/85 134/84 (!) 148/82  Pulse: 81 70 69 70  Resp: (!) 22 20 18 18   Temp:      TempSrc:      SpO2: 98% 96% 97% 97%  Weight:      Height:        Intake/Output Summary (Last 24 hours) at 08/23/2020 0723 Last data filed at 08/23/2020 0255 Gross per 24 hour  Intake 500 ml  Output --  Net 500 ml   Filed Weights   08/22/20 2216  Weight: 104.3 kg   Body mass index is 31.19 kg/m.   General: Well developed, well nourished, NAD Neck: Negative for carotid bruits. No JVD Lungs:Clear to ausculation bilaterally. No wheezes, rales, or rhonchi. Breathing is unlabored. Cardiovascular: RRR with S1 S2. No murmurs Abdomen: Soft, non-tender, non-distended. No obvious abdominal masses. Extremities: No edema. Radial pulses 2+ bilaterally Neuro: Alert and oriented. No focal deficits. No facial asymmetry. MAE spontaneously. Psych: Responds to questions appropriately with normal affect.    EKG:  The EKG was personally reviewed and demonstrates: 08/22/20 NSR with evidence of inferior Q waves and no ST elevation or depressions.  Telemetry:  Telemetry was personally reviewed and demonstrates:  08/23/20 NSR with rates in the 60's   Relevant CV Studies:  None  Laboratory Data:  Chemistry Recent Labs  Lab 08/22/20 2304  NA 145  K 3.8  CL 109  CO2 27  GLUCOSE 124*  BUN 23  CREATININE 1.46*  CALCIUM 9.5  GFRNONAA 51*  ANIONGAP 9    No results found for: PROT, ALBUMIN, AST, ALT, ALKPHOS, BILITOT Hematology Recent Labs  Lab 08/22/20 2304 08/23/20 0532  WBC 7.5 9.0  RBC 5.07 4.21*  HGB 15.7 13.2  HCT 45.5 38.7*  MCV 89.7 91.9  MCH 31.0 31.4  MCHC 34.5 34.1  RDW 13.7 13.8  PLT 166 142*   Cardiac EnzymesNo results for input(s): TROPONINI in the last 168 hours. No results for input(s): TROPIPOC in the last 168 hours.  BNPNo results for input(s): BNP, PROBNP in the last 168 hours.  DDimer No results for input(s): DDIMER  in the last 168 hours. TSH: No results found for: TSH Lipids:No results found for: CHOL, HDL, LDLCALC, LDLDIRECT, TRIG, CHOLHDL HgbA1c:No results found for: HGBA1C  Radiology/Studies:  DG Chest 2 View  Result Date: 08/22/2020 CLINICAL DATA:  Chest pain EXAM: CHEST - 2 VIEW COMPARISON:  03/05/2016 FINDINGS: Cardiac shadow is within normal limits. Calcified granuloma is again noted in the left apex. No acute infiltrate or effusion is seen. No bony abnormality is noted. IMPRESSION: No active cardiopulmonary disease. Electronically Signed   By: Inez Catalina M.D.   On: 08/22/2020 22:37   Assessment and Plan:   1. NSTEMI: -Pt presented with to [facility] 08/22/20 with a 2 day hx of what initially  began as right shoulder pain which progressed to anterior chest tightness. 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 On Thursday night, approximately 1 hour after attempting to go to sleep, he began having chest tightness that worsened the longer that he laid in bed.  -EKG with NSR and inferior Q waves with no old tracing for comparison. -HsT initially found to be 1013>>852.  -Creatinine at 1.46 with no comparison.  -Hb stable at 13.2.  -He was given ASA 324mg  and IV Heparin.  -CXR with no acute cardiopulmonary findings. -Would plan for CCTA versus LHC however will need to be mindful of Cr prior to testing -Risk stratify with Lipid panel and HbA1c -Start ASA 81 QD   -Based on lipid results>>may need to up titrate statin -On home irbesartan/HCTZ>>continue to hold due to AKI -Start low dose beta blocker and follow    2. HTN: -Stable but elevated, 148/75>>144/85>>141/80 however was found to be intermittently hypotensive on admission  -Primary holding antihypertensives (irbesartan/HCTZ) at this time however will start low dose beat blocker   3.  HLD: -Last LDL unknown -Obtain lipid panel  -Plan to up titrate atorvastatin to high intensity 80mg  QD based on results   4. Presumed acute kidney insufficiency: -Unclear baseline with no records to review -Cr on admission at 1.46>>recieving IVF hydration  -Will follow closely however may limit CV workup due to contrast dye   5. Obestiy: -BMI, 31.1 -Active at baseline   For questions or updates, please contact [place] Please consult [email or web address] for contact info under Cardiology/STEMI.   SignedKathyrn Drown NP-C [facility] Pager: [phone] 08/23/2020 7:23 AM

## 2020-08-23 NOTE — Progress Notes (Signed)
ANTICOAGULATION CONSULT NOTE - Initial Consult  Pharmacy Consult for Heparin Indication: chest pain/ACS  Allergies  Allergen Reactions  . Other Other (See Comments)    Patient Measurements: Height: 6' (182.9 cm) Weight: 104.3 kg (230 lb) IBW/kg (Calculated) : 77.6 Heparin dosing weight: 100kg  Vital Signs: Temp: 97.8 F (36.6 C) (03/31 2216) Temp Source: Oral (03/31 2216) BP: 162/83 (04/01 0000) Pulse Rate: 73 (04/01 0000)  Labs: Recent Labs    08/22/20 2304  HGB 15.7  HCT 45.5  PLT 166  CREATININE 1.46*  TROPONINIHS 1,013*    Estimated Creatinine Clearance: 57.1 mL/min (A) (by C-G formula based on SCr of 1.46 mg/dL (H)).   Medical History: Past Medical History:  Diagnosis Date  . Cancer ([place])   . Hyperlipidemia   . Hypertension     Medications:  Infusions:    Assessment: 75 yo M presents with chest pain.  Elevated troponin, no ST elevation.  No hx CAD.  Baseline CBC WNL, no bleeding noted. Not on anticoagulation PTA.   Goal of Therapy:  Heparin level 0.3-0.7 units/ml Monitor platelets by anticoagulation protocol: Yes   Plan:  Heparin 4000 units IV bolus followed by heparin 1200 units/hr Check 8h heparin level after heparin initiated Daily heparin level & CBC while on heparin  Netta Cedars PharmD, BCPS 08/23/2020,12:33 AM

## 2020-08-23 NOTE — Consult Note (Signed)
Cardiology Consultation:   Patient ID: Jerome Wilkerson; [id number]; 1945-06-30   Admit date: 08/22/2020 Date of Consult: 08/23/2020  Primary Care Provider: Jonathon Jordan, MD Primary Cardiologist: New to [facility]  Patient Profile:   Jerome Wilkerson is a 75 y.o. male with a hx of HTN, HLD, obesity and GERD who is being seen today for the evaluation of NSTEMI at the request of Dr. Marlowe Sax.  History of Present Illness:   Jerome Wilkerson is a 75yo M with a hx as stated above who presented to [facility] 08/22/20 with a 2 day hx of what initially began as right shoulder pain which progressed to anterior chest tightness. 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 On Thursday night, approximately 1 hour after attempting to go to sleep, he bagn having chest tightness that worsened the longer that he laid in bed. He denies associated diaphoresis but recalls having some nausea. He is currently chest pain free. He has no prior personal or family hx of CAD. He denies hx of tobacco or alcohol use. He denies recent SOB, LE edema, orthopnea, dizziness, or syncope. No recent illness with fever or chills. He is active at baseline and denies prior hx of chest pain.   In the ED, EKG with NSR and inferior Q waves with no old tracing for comparison. HsT initially found to be 1013>>852. Creatinine at 1.46 with no comparison. Hb stable at 13.2. He was given ASA 324mg  and IV Heparin. CXR with no acute cardiopulmonary findings.   Past Medical History:  Diagnosis Date  . Cancer ([place])   . Hyperlipidemia   . Hypertension     History reviewed. No pertinent surgical history.   Prior to Admission medications   Medication Sig Start Date End Date Taking? Authorizing Provider  atorvastatin (LIPITOR) 20 MG tablet Take 20 mg by mouth daily. 08/09/20  Yes [provider]   cholecalciferol (VITAMIN D3) 25 MCG (1000 UNIT) tablet Take 1,000 Units by mouth daily.   Yes [provider]  irbesartan-hydrochlorothiazide (AVALIDE) 300-12.5 MG tablet Take 1 tablet by mouth daily. 08/09/20  Yes [provider]  Multiple Vitamins-Minerals (CENTRUM SILVER 50+MEN) TABS Take 1 tablet by mouth daily.   Yes [provider]  omeprazole (PRILOSEC) 40 MG capsule Take 40 mg by mouth daily. 05/20/20  Yes [provider]    Inpatient Medications: Scheduled Meds: . atorvastatin  20 mg Oral Daily  . lidocaine  1 patch Transdermal Q24H  . pantoprazole  40 mg Oral Daily   Continuous Infusions: . heparin 1,200 Units/hr (08/23/20 0103)   PRN Meds: acetaminophen, morphine injection, ondansetron (ZOFRAN) IV  Allergies:    Allergies  Allergen Reactions  . Other Other (See Comments)    Social History:   Social History   Socioeconomic History  . Marital status: Married    Spouse name: Not on file  . Number of children: Not on file  . Years of education: Not on file  . Highest education level: Not on file  Occupational History  . Not on file  Tobacco Use  . Smoking status: Former Research scientist (life sciences)  . Smokeless tobacco: Never Used  Substance and Sexual Activity  . Alcohol use: Not Currently  . Drug use: Never  . Sexual activity: Not on file  Other Topics Concern  . Not on file  Social History  Narrative  . Not on file   Social Determinants of Health   Financial Resource Strain: Not on file  Food Insecurity: Not on file  Transportation Needs: Not on file  Physical Activity: Not on file  Stress: Not on file  Social Connections: Not on file  Intimate Partner Violence: Not on file    Family History:   History reviewed. No pertinent family history. Family Status:  No family status information on file.    ROS:  Please see the history of present illness.  All other ROS reviewed and negative.     Physical Exam/Data:   Vitals:    08/23/20 0530 08/23/20 0600 08/23/20 0630 08/23/20 0700  BP: 134/73 132/85 134/84 (!) 148/82  Pulse: 81 70 69 70  Resp: (!) 22 20 18 18   Temp:      TempSrc:      SpO2: 98% 96% 97% 97%  Weight:      Height:        Intake/Output Summary (Last 24 hours) at 08/23/2020 0723 Last data filed at 08/23/2020 0255 Gross per 24 hour  Intake 500 ml  Output --  Net 500 ml   Filed Weights   08/22/20 2216  Weight: 104.3 kg   Body mass index is 31.19 kg/m.   General: Well developed, well nourished, NAD Neck: Negative for carotid bruits. No JVD Lungs:Clear to ausculation bilaterally. No wheezes, rales, or rhonchi. Breathing is unlabored. Cardiovascular: RRR with S1 S2. No murmurs Abdomen: Soft, non-tender, non-distended. No obvious abdominal masses. Extremities: No edema. Radial pulses 2+ bilaterally Neuro: Alert and oriented. No focal deficits. No facial asymmetry. MAE spontaneously. Psych: Responds to questions appropriately with normal affect.    EKG:  The EKG was personally reviewed and demonstrates: 08/22/20 NSR with evidence of inferior Q waves and no ST elevation or depressions.  Telemetry:  Telemetry was personally reviewed and demonstrates:  08/23/20 NSR with rates in the 60's   Relevant CV Studies:  None  Laboratory Data:  Chemistry Recent Labs  Lab 08/22/20 2304  NA 145  K 3.8  CL 109  CO2 27  GLUCOSE 124*  BUN 23  CREATININE 1.46*  CALCIUM 9.5  GFRNONAA 51*  ANIONGAP 9    No results found for: PROT, ALBUMIN, AST, ALT, ALKPHOS, BILITOT Hematology Recent Labs  Lab 08/22/20 2304 08/23/20 0532  WBC 7.5 9.0  RBC 5.07 4.21*  HGB 15.7 13.2  HCT 45.5 38.7*  MCV 89.7 91.9  MCH 31.0 31.4  MCHC 34.5 34.1  RDW 13.7 13.8  PLT 166 142*   Cardiac EnzymesNo results for input(s): TROPONINI in the last 168 hours. No results for input(s): TROPIPOC in the last 168 hours.  BNPNo results for input(s): BNP, PROBNP in the last 168 hours.  DDimer No results for input(s): DDIMER  in the last 168 hours. TSH: No results found for: TSH Lipids:No results found for: CHOL, HDL, LDLCALC, LDLDIRECT, TRIG, CHOLHDL HgbA1c:No results found for: HGBA1C  Radiology/Studies:  DG Chest 2 View  Result Date: 08/22/2020 CLINICAL DATA:  Chest pain EXAM: CHEST - 2 VIEW COMPARISON:  03/05/2016 FINDINGS: Cardiac shadow is within normal limits. Calcified granuloma is again noted in the left apex. No acute infiltrate or effusion is seen. No bony abnormality is noted. IMPRESSION: No active cardiopulmonary disease. Electronically Signed   By: Inez Catalina M.D.   On: 08/22/2020 22:37   Assessment and Plan:   1. NSTEMI: -Pt presented with to [facility] 08/22/20 with a 2 day hx of what initially  began as right shoulder pain which progressed to anterior chest tightness. 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 On Thursday night, approximately 1 hour after attempting to go to sleep, he began having chest tightness that worsened the longer that he laid in bed.  -EKG with NSR and inferior Q waves with no old tracing for comparison. -HsT initially found to be 1013>>852.  -Creatinine at 1.46 with no comparison.  -Hb stable at 13.2.  -He was given ASA 324mg  and IV Heparin.  -CXR with no acute cardiopulmonary findings. -Would plan for CCTA versus LHC however will need to be mindful of Cr prior to testing -Risk stratify with Lipid panel and HbA1c -Start ASA 81 QD   -Based on lipid results>>may need to up titrate statin -On home irbesartan/HCTZ>>continue to hold due to AKI -Start low dose beta blocker and follow    2. HTN: -Stable but elevated, 148/75>>144/85>>141/80 however was found to be intermittently hypotensive on admission  -Primary holding antihypertensives (irbesartan/HCTZ) at this time however will start low dose beat blocker   3.  HLD: -Last LDL unknown -Obtain lipid panel  -Plan to up titrate atorvastatin to high intensity 80mg  QD based on results   4. Presumed acute kidney insufficiency: -Unclear baseline with no records to review -Cr on admission at 1.46>>recieving IVF hydration  -Will follow closely however may limit CV workup due to contrast dye   5. Obestiy: -BMI, 31.1 -Active at baseline   For questions or updates, please contact [place] Please consult [email or web address] for contact info under Cardiology/STEMI.   SignedKathyrn Drown NP-C [facility] Pager: [phone] 08/23/2020 7:23 AM

## 2020-08-23 NOTE — ED Provider Notes (Signed)
Patient signed out pending lab work-up.  In brief presented with some right arm pain and chest discomfort.  Right arm pain proceeded and was thought to be musculoskeletal with some reproducibility on exam.  However last night he began to have some chest pressure that radiated from the right to the left chest.  It is not exertional nature.  He does have a history of hypertension.  EKG without prior comparison with some inferior Q changes.  No ST elevation.  Initial troponin 1013.  On my evaluation, patient is no longer having any chest discomfort.  He is continuing to complain of right arm pain refractory to morphine.  He has some point tenderness over the posterior right shoulder.  Will redose medications.  Started on heparin and nitro for blood pressure 160/85.  Will discuss with cardiology.  He has never had a cardiac catheterization.   Physical Exam  BP (!) 160/85   Pulse 80   Temp 97.8 F (36.6 C) (Oral)   Resp 20   Ht 1.829 m (6')   Wt 104.3 kg   SpO2 93%   BMI 31.19 kg/m   Physical Exam Non-ill-appearing, nontoxic No respiratory distress ED Course/Procedures     Procedures CRITICAL CARE Performed by: Merryl Hacker   Total critical care time: 31 minutes  Critical care time was exclusive of separately billable procedures and treating other patients.  Critical care was necessary to treat or prevent imminent or life-threatening deterioration.  Critical care was time spent personally by me on the following activities: development of treatment plan with patient and/or surrogate as well as nursing, discussions with consultants, evaluation of patient's response to treatment, examination of patient, obtaining history from patient or surrogate, ordering and performing treatments and interventions, ordering and review of laboratory studies, ordering and review of radiographic studies, pulse oximetry and re-evaluation of patient's condition.  MDM   Problem List Items Addressed This  Visit   None   Visit Diagnoses    Chest pain, unspecified type    -  Primary   Pain of right upper extremity       NSTEMI (non-ST elevated myocardial infarction) (HCC)       Relevant Medications   aspirin chewable tablet 324 mg (Completed)   atorvastatin (LIPITOR) 20 MG tablet   irbesartan-hydrochlorothiazide (AVALIDE) 300-12.5 MG tablet   nitroGLYCERIN (NITROGLYN) 2 % ointment 1 inch (Start on 08/23/2020 12:30 AM)            Merryl Hacker, MD 08/23/20 0025

## 2020-08-23 NOTE — Interval H&P Note (Signed)
History and Physical Interval Note:  08/23/2020 4:14 PM  Jerome Wilkerson  has presented today for surgery, with the diagnosis of nstemi.  The various methods of treatment have been discussed with the patient and family. After consideration of risks, benefits and other options for treatment, the patient has consented to  Procedure(s): LEFT HEART CATH AND CORONARY ANGIOGRAPHY (N/A) as a surgical intervention.  The patient's history has been reviewed, patient examined, no change in status, stable for surgery.  I have reviewed the patient's chart and labs.  Questions were answered to the patient's satisfaction.   Cath Lab Visit (complete for each Cath Lab visit)  Clinical Evaluation Leading to the Procedure:   ACS: Yes.    Non-ACS:    Anginal Classification: CCS IV  Anti-ischemic medical therapy: No Therapy  Non-Invasive Test Results: No non-invasive testing performed  Prior CABG: No previous CABG        [name] [facility] 08/23/2020 4:14 PM

## 2020-08-23 NOTE — ED Notes (Signed)
[facility] at facility to transport patient to [facility] cone cath lab

## 2020-08-24 ENCOUNTER — Inpatient Hospital Stay (HOSPITAL_COMMUNITY): Payer: BC Managed Care – PPO

## 2020-08-24 ENCOUNTER — Other Ambulatory Visit (HOSPITAL_COMMUNITY): Payer: BC Managed Care – PPO

## 2020-08-24 DIAGNOSIS — R079 Chest pain, unspecified: Secondary | ICD-10-CM

## 2020-08-24 LAB — ECHOCARDIOGRAM COMPLETE
AR max vel: 2.39 cm2
AV Area VTI: 2.39 cm2
AV Area mean vel: 2.11 cm2
AV Mean grad: 3 mmHg
AV Peak grad: 4.8 mmHg
Ao pk vel: 1.09 m/s
Area-P 1/2: 3.65 cm2
Height: 72 in
MV VTI: 1.67 cm2
S' Lateral: 3.8 cm
Weight: 3680 oz

## 2020-08-24 LAB — CBC
HCT: 41.3 % (ref 39.0–52.0)
Hemoglobin: 14 g/dL (ref 13.0–17.0)
MCH: 30.8 pg (ref 26.0–34.0)
MCHC: 33.9 g/dL (ref 30.0–36.0)
MCV: 91 fL (ref 80.0–100.0)
Platelets: 133 10*3/uL — ABNORMAL LOW (ref 150–400)
RBC: 4.54 MIL/uL (ref 4.22–5.81)
RDW: 13.9 % (ref 11.5–15.5)
WBC: 8.8 10*3/uL (ref 4.0–10.5)
nRBC: 0 % (ref 0.0–0.2)

## 2020-08-24 MED ORDER — ISOSORBIDE MONONITRATE ER 30 MG PO TB24: 30.0000 mg | ORAL_TABLET | Freq: Every day | ORAL | 1 refills | Status: DC

## 2020-08-24 MED ORDER — LIDOCAINE 5 % EX PTCH: 1.0000 | MEDICATED_PATCH | CUTANEOUS | 0 refills | Status: DC

## 2020-08-24 MED ORDER — PERFLUTREN LIPID MICROSPHERE
1.0000 mL | INTRAVENOUS | Status: DC | PRN
  Administered 2020-08-24: 3 mL via INTRAVENOUS
  Filled 2020-08-24: qty 10

## 2020-08-24 MED ORDER — METOPROLOL TARTRATE 25 MG PO TABS: 25.0000 mg | ORAL_TABLET | Freq: Two times a day (BID) | ORAL | 1 refills | Status: DC

## 2020-08-24 MED ORDER — ATORVASTATIN CALCIUM 80 MG PO TABS: 80.0000 mg | ORAL_TABLET | Freq: Every day | ORAL | 0 refills | Status: DC

## 2020-08-24 MED ORDER — ASPIRIN 81 MG PO TBEC: 81.0000 mg | DELAYED_RELEASE_TABLET | Freq: Every day | ORAL | 11 refills | Status: DC

## 2020-08-24 MED ORDER — CLOPIDOGREL BISULFATE 75 MG PO TABS: 75.0000 mg | ORAL_TABLET | Freq: Every day | ORAL | 1 refills | Status: DC

## 2020-08-24 NOTE — Progress Notes (Signed)
Progress Note  Patient Name: Jerome Wilkerson Date of Encounter: 08/24/2020  Primary Cardiologist: No primary care provider on file.   Subjective   No CP or SOB. Wants to go home. Anticipating echo.  Inpatient Medications    Scheduled Meds: . aspirin EC  81 mg Oral Daily  . atorvastatin  80 mg Oral Daily  . clopidogrel  75 mg Oral Daily  . enoxaparin (LOVENOX) injection  40 mg Subcutaneous Q24H  . isosorbide mononitrate  30 mg Oral Daily  . lidocaine  1 patch Transdermal Q24H  . metoprolol tartrate  25 mg Oral BID  . pantoprazole  40 mg Oral Daily  . sodium chloride flush  3 mL Intravenous Q12H  . sodium chloride flush  3 mL Intravenous Q12H   Continuous Infusions: . sodium chloride     PRN Meds: sodium chloride, acetaminophen, ondansetron (ZOFRAN) IV, oxyCODONE, sodium chloride flush   Vital Signs    Vitals:   08/23/20 2153 08/24/20 0038 08/24/20 0438 08/24/20 1031  BP: 123/66 117/65 124/69 124/69  Pulse: 85 77 76 72  Resp:  19 19 19   Temp:   98.8 F (37.1 C) 99.2 F (37.3 C)  TempSrc:   Oral Oral  SpO2:  91% 93%   Weight:      Height:        Intake/Output Summary (Last 24 hours) at 08/24/2020 1041 Last data filed at 08/23/2020 1817 Gross per 24 hour  Intake 901.78 ml  Output --  Net 901.78 ml   Filed Weights   08/22/20 2216  Weight: 104.3 kg    Telemetry    SR PVCs - Personally Reviewed  ECG    SR, Q wave inferiorly - Personally Reviewed  Physical Exam   GEN: No acute distress.   Neck: No JVD Cardiac: regular rhythm, normal rate, no murmurs, rubs, or gallops.  Respiratory: Clear to auscultation bilaterally. GI: Soft, nontender, non-distended  MS: No edema; No deformity. Neuro:  Nonfocal  Psych: Normal affect   Labs    Chemistry Recent Labs  Lab 08/22/20 2304 08/23/20 0927 08/23/20 1824  NA 145 142  --   K 3.8 3.9  --   CL 109 108  --   CO2 27 26  --   GLUCOSE 124* 103*  --   BUN 23 19  --   CREATININE 1.46* 1.25* 1.17  CALCIUM  9.5 9.1  --   GFRNONAA 51* >60 >60  ANIONGAP 9 8  --      Hematology Recent Labs  Lab 08/23/20 0532 08/23/20 1824 08/24/20 0249  WBC 9.0 11.0* 8.8  RBC 4.21* 4.88 4.54  HGB 13.2 15.1 14.0  HCT 38.7* 44.4 41.3  MCV 91.9 91.0 91.0  MCH 31.4 30.9 30.8  MCHC 34.1 34.0 33.9  RDW 13.8 13.7 13.9  PLT 142* 183 133*    Cardiac EnzymesNo results for input(s): TROPONINI in the last 168 hours. No results for input(s): TROPIPOC in the last 168 hours.   BNPNo results for input(s): BNP, PROBNP in the last 168 hours.   DDimer No results for input(s): DDIMER in the last 168 hours.   Radiology    DG Chest 2 View  Result Date: 08/22/2020 CLINICAL DATA:  Chest pain EXAM: CHEST - 2 VIEW COMPARISON:  03/05/2016 FINDINGS: Cardiac shadow is within normal limits. Calcified granuloma is again noted in the left apex. No acute infiltrate or effusion is seen. No bony abnormality is noted. IMPRESSION: No active cardiopulmonary disease. Electronically Signed  By: Inez Catalina M.D.   On: 08/22/2020 22:37   DG Shoulder Right  Result Date: 08/23/2020 CLINICAL DATA:  Right shoulder pain after throwing injury. EXAM: RIGHT SHOULDER - 2+ VIEW COMPARISON:  None. FINDINGS: No fracture or dislocation. Ovoid calcified density overlapping the superior glenoid without visible donor site. Mild spurring at the inferior glenoid. No detected joint space narrowing. IMPRESSION: 1. No acute fracture or malalignment. 2. Glenoid spur versus rotator cuff calcification. Electronically Signed   By: Monte Fantasia M.D.   On: 08/23/2020 07:45   CARDIAC CATHETERIZATION  Result Date: 08/23/2020  Prox RCA to Mid RCA lesion is 20% stenosed.  RPDA lesion is 50% stenosed.  Mid LAD-1 lesion is 40% stenosed.  Mid LAD-2 lesion is 30% stenosed.  Prox Cx lesion is 100% stenosed.  LV end diastolic pressure is mildly elevated.  The left ventricular systolic function is normal.  The left ventricular ejection fraction is 55-65% by visual  estimate.  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 Very faint, late collaterals. 2. Good LV function. 3. Mildly elevated LVEDP Plan: assess lateral wall better with Echo. Patient is > 24 hours out from his infarct and is pain free. Would recommend optimal medical therapy.    Cardiac Studies   Cath:   Prox RCA to Mid RCA lesion is 20% stenosed.  RPDA lesion is 50% stenosed.  Mid LAD-1 lesion is 40% stenosed.  Mid LAD-2 lesion is 30% stenosed.  Prox Cx lesion is 100% stenosed.  LV end diastolic pressure is mildly elevated.  The left ventricular systolic function is normal.  The left ventricular ejection fraction is 55-65% by visual estimate.   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 Very faint, late collaterals. 2. Good LV function.  3. Mildly elevated LVEDP   Plan: assess lateral wall better with Echo. Patient is > 24 hours out from his infarct and is pain free. Would recommend optimal medical therapy.    Echo pending.  Patient Profile       Jerome Wilkerson is a 75 y.o. male with a hx of HTN, HLD, obesity and GERD who is being seen today for the evaluation of NSTEMI with occluded proximal circumflex artery, with faint, late collaterals, felt best for medical management.  Assessment & Plan   Principal Problem:   ACS (acute coronary syndrome) (HCC) Active Problems:   Right shoulder pain   HTN (hypertension)   HLD (hyperlipidemia)   GERD (gastroesophageal reflux disease)   NSTEMI (non-ST elevated myocardial infarction) ([place])   1. Nstemi -  - occluded circumflex with faint late collaterals. Will further risk stratify with echo.  - continue ASA 81 mg daily and clopidogrel 75 mg daily  - atorvastatin increased to 80 mg daily - imdur 30 mg daily -metoprolol 25 mg Bid - I have instructed the patient that dual antiplatelet therapy should be taken for 1 year without interruption for indication of ACS. No PCI performed, could discuss  holding if needed for procedures, should be reviewed by primary cardiologist.   2. HTN - BP well controlled today, he is on metoprolol in hospital.  - can like resume irbesartan/hctz home med when ready for discharge.  3. HLD - on atorvastatin 80 mg daily now. LDL 101 on atorvastatin 20 mg, will likely improve with dose intensification.  4. AKi - improved with fluids, Cr 1.25 today.       For questions or updates, please contact [place] Please consult [email or web address] for contact info  under        Signed, Elouise Munroe, MD  08/24/2020, 10:41 AM

## 2020-08-24 NOTE — Discharge Instructions (Signed)
Heart Attack A heart attack occurs when blood and oxygen supply to the heart is cut off. A heart attack causes damage to the heart that cannot be fixed. A heart attack is also called a myocardial infarction, or MI. If you think you are having a heart attack, do not wait to see if the symptoms will go away. Get medical help right away. What are the causes? This condition may be caused by:  A fatty substance (plaque) in the blood vessels (arteries). This can block the flow of blood to the heart.  A blood clot in the blood vessels that go to the heart. The blood clot blocks blood flow.  Low blood pressure.  An abnormal heartbeat.  Some diseases, such as problems in red blood cells (anemia)orproblems in breathing (respiratory failure).  Tightening (spasm) of a blood vessel that cuts off blood to the heart.  A tear in a blood vessel of the heart.  High blood pressure.   What increases the risk? The following factors may make you more likely to develop this condition:  Aging. The older you are, the higher your risk.  Having a personal or family history of chest pain, heart attack, stroke, or narrowing of the arteries in the legs, arms, head, or stomach (peripheral artery disease).  Being male.  Smoking.  Not getting regular exercise.  Being overweight or obese.  Having high blood pressure.  Having high cholesterol.  Having diabetes.  Drinking too much alcohol.  Using illegal drugs, such as cocaine or methamphetamine. What are the signs or symptoms? Symptoms of this condition include:  Chest pain. It may feel like: ? Crushing or squeezing. ? Tightness, pressure, fullness, or heaviness.  Pain in the arm, neck, jaw, back, or upper body.  Shortness of breath.  Heartburn.  Upset stomach (indigestion).  Feeling like you may vomit (nauseous).  Cold sweats.  Feeling tired.  Sudden light-headedness. How is this treated? A heart attack must be treated as soon as  possible. Treatment may include:  Medicines to: ? Break up or dissolve blood clots. ? Thin blood and help prevent blood clots. ? Treat blood pressure. ? Improve blood flow to the heart. ? Reduce pain. ? Reduce cholesterol.  Procedures to widen a blocked artery and keep it open.  Open heart surgery.  Receiving oxygen.  Making your heart strong again (cardiac rehabilitation) through exercise, education, and counseling.   Follow these instructions at home: Medicines  Take over-the-counter and prescription medicines only as told by your doctor. You may need to take medicine: ? To keep your blood from clotting too easily. ? To control blood pressure. ? To lower cholesterol. ? To control heart rhythms.  Do not take these medicines unless your doctor says it is okay: ? NSAIDs, such as ibuprofen. ? Supplements that have vitamin A, vitamin E, or both. ? Hormone replacement therapy that has estrogen with or without progestin. Lifestyle  Do not use any products that have nicotine or tobacco, such as cigarettes, e-cigarettes, and chewing tobacco. If you need help quitting, ask your doctor.  Avoid secondhand smoke.  Exercise regularly. Ask your doctor about a cardiac rehab program.  Eat heart-healthy foods. Your doctor will tell you what foods to eat.  Stay at a healthy weight.  Lower your stress level.  Do not use illegal drugs.      Alcohol use  Do not drink alcohol if: ? Your doctor tells you not to drink. ? You are pregnant, may be pregnant, or   are planning to become pregnant.  If you drink alcohol: ? Limit how much you use to:  0-1 drink a day for women.  0-2 drinks a day for men. ? Know how much alcohol is in your drink. In the [place] one drink equals one 12 oz bottle of beer (355 mL), one 5 oz glass of wine (148 mL), or one 1 oz glass of hard liquor (44 mL). General instructions  Work with your doctor to treat other problems you may have, such as diabetes or  high blood pressure.  Get screened for depression. Get treatment if needed.  Keep your vaccines up to date. Get the flu shot (influenza vaccine) every year.  Keep all follow-up visits as told by your doctor. This is important. Contact a doctor if:  You feel very sad.  You have trouble doing your daily activities. Get help right away if:  You have sudden, unexplained discomfort in your chest, arms, back, neck, jaw, or upper body.  You have shortness of breath.  You have sudden sweating or clammy skin.  You feel like you may vomit.  You vomit.  You feel tired or weak.  You get light-headed or dizzy.  You feel your heart beating fast.  You feel your heart skipping beats.  You have blood pressure that is higher than 180/120. These symptoms may be an emergency. Do not wait to see if the symptoms will go away. Get medical help right away. Call your local emergency services (911 in the [place] Do not drive yourself to the hospital. Summary  A heart attack occurs when blood and oxygen supply to the heart is cut off.  Do not take NSAIDs unless your doctor says it is okay.  Do not smoke. Avoid secondhand smoke.  Exercise regularly. Ask your doctor about a cardiac rehab program. This information is not intended to replace advice given to you by your health care provider. Make sure you discuss any questions you have with your health care provider. Document Revised: 08/22/2018 Document Reviewed: 08/22/2018 Elsevier Patient Education  [employer]  This sheet gives you information about how to care for yourself after your procedure. Your health care provider may also give you more specific instructions. If you have problems or questions, contact your health care provider. What can I expect after the procedure? After the procedure, it is common to have:  Bruising and tenderness at the catheter insertion area. Follow these instructions at  home: Medicines  Take over-the-counter and prescription medicines only as told by your health care provider. Insertion site care  Follow instructions from your health care provider about how to take care of your insertion site. Make sure you: ? Wash your hands with soap and water before you change your bandage (dressing). If soap and water are not available, use hand sanitizer. ? Change your dressing as told by your health care provider. ? Leave stitches (sutures), skin glue, or adhesive strips in place. These skin closures may need to stay in place for 2 weeks or longer. If adhesive strip edges start to loosen and curl up, you may trim the loose edges. Do not remove adhesive strips completely unless your health care provider tells you to do that.  Check your insertion site every day for signs of infection. Check for: ? Redness, swelling, or pain. ? Fluid or blood. ? Pus or a bad smell. ? Warmth.  Do not take baths, swim, or use a hot tub until  your health care provider approves.  You may shower 24-48 hours after the procedure, or as directed by your health care provider. ? Remove the dressing and gently wash the site with plain soap and water. ? Pat the area dry with a clean towel. ? Do not rub the site. That could cause bleeding.  Do not apply powder or lotion to the site. Activity  For 24 hours after the procedure, or as directed by your health care provider: ? Do not flex or bend the affected arm. ? Do not push or pull heavy objects with the affected arm. ? Do not drive yourself home from the hospital or clinic. You may drive 24 hours after the procedure unless your health care provider tells you not to. ? Do not operate machinery or power tools.  Do not lift anything that is heavier than 10 lb (4.5 kg), or the limit that you are told, until your health care provider says that it is safe.  Ask your health care provider when it is okay to: ? Return to work or school. ? Resume  usual physical activities or sports. ? Resume sexual activity.   General instructions  If the catheter site starts to bleed, raise your arm and put firm pressure on the site. If the bleeding does not stop, get help right away. This is a medical emergency.  If you went home on the same day as your procedure, a responsible adult should be with you for the first 24 hours after you arrive home.  Keep all follow-up visits as told by your health care provider. This is important. Contact a health care provider if:  You have a fever.  You have redness, swelling, or yellow drainage around your insertion site. Get help right away if:  You have unusual pain at the radial site.  The catheter insertion area swells very fast.  The insertion area is bleeding, and the bleeding does not stop when you hold steady pressure on the area.  Your arm or hand becomes pale, cool, tingly, or numb. These symptoms may represent a serious problem that is an emergency. Do not wait to see if the symptoms will go away. Get medical help right away. Call your local emergency services (911 in the [place] Do not drive yourself to the hospital. Summary  After the procedure, it is common to have bruising and tenderness at the site.  Follow instructions from your health care provider about how to take care of your radial site wound. Check the wound every day for signs of infection.  Do not lift anything that is heavier than 10 lb (4.5 kg), or the limit that you are told, until your health care provider says that it is safe. This information is not intended to replace advice given to you by your health care provider. Make sure you discuss any questions you have with your health care provider. Document Revised: 06/16/2017 Document Reviewed: 06/16/2017 Elsevier Patient Education  2021 [employer].

## 2020-08-24 NOTE — Progress Notes (Signed)
  Echocardiogram 2D Echocardiogram has been performed with [facility].  Jerome Wilkerson 08/24/2020, 2:42 PM

## 2020-08-24 NOTE — Discharge Summary (Signed)
Physician Discharge Summary  Patient ID: Jerome Wilkerson MRN: [id number] DOB/AGE: 1945-07-08 75 y.o.  Admit date: 08/22/2020 Discharge date: 08/24/2020  Admission Diagnoses:  Discharge Diagnoses:  Principal Problem:   ACS (acute coronary syndrome) ([place]) Active Problems:   Right shoulder pain   HTN (hypertension)   HLD (hyperlipidemia)   GERD (gastroesophageal reflux disease)   NSTEMI (non-ST elevated myocardial infarction) [facility])   Discharged Condition: stable  Hospital Course:  Jerome Wilkerson is a 75 year old male with past medical history significant for hypertension, hyperlipidemia, obesity (BMI 31.19) and GERD.  Patient presented to the ED with chest and right arm pain.On presentation, high-sensitivity troponin was elevated (1013 > 852), EKG revealed inferior Q waves and X-ray was negative for acute finding.  [facility] and [facility]. Cardiac catheterization done during the hospital stay revealed single vessel occlusive CAD, 100% proximal LCx occlusion, with very faint, late collaterals, good LV function and mildly elevated LVEDP.  Medical management was advised.  Cardiology team directed patient's care during the hospital stay.  Cardiology team has cleared patient for discharge.  Echocardiogram was done prior to discharge.  NSTEMI:  -See above documentation. -Patient will be discharged on aspirin 81 Mg p.o. once daily, Plavix 75 Mg p.o. once daily and, patient will remain on both medications for 1 year. -Other discharge medications includes Lipitor 80 mg p.o. once daily, Imdur 30 mg p.o. once daily and metoprolol 25 Mg p.o. twice daily.  Right shoulder pain:  -Patient played basketball a couple to 3 days prior to presentation. -Patient developed significant right shoulder pain. -The right shoulder pain responded to analgesics. -Patient has been advised to follow-up with orthopedic surgery team if right shoulder pain  persists.  Hypertension -Continue above medications. -Resume irbesartan/HCTZ. -Continue to optimize blood pressure control.    Hyperlipidemia -Increase Lipitor from 20 Mg p.o. once daily to 80 Mg p.o. once daily.    Obesity (BMI 31.19) -Encourage lifestyle modifications such as healthy diet and exercise.  Consults: cardiology  Significant Diagnostic Studies:  Cardiac cath revealed:   Prox RCA to Mid RCA lesion is 20% stenosed.  RPDA lesion is 50% stenosed.  Mid LAD-1 lesion is 40% stenosed.  Mid LAD-2 lesion is 30% stenosed.  Prox Cx lesion is 100% stenosed.  LV end diastolic pressure is mildly elevated.  The left ventricular systolic function is normal.  The left ventricular ejection fraction is 55-65% by visual estimate.   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 Very faint, late collaterals. 2. Good LV function.  3. Mildly elevated LVEDP  Echo: 1. Left ventricular ejection fraction, by estimation, is 50 to 55%. The  left ventricle has low normal function. The left ventricle demonstrates  regional wall motion abnormalities (inferior and inferolateral described  below). There is moderate concentric  left ventricular hypertrophy. Left ventricular diastolic parameters are  consistent with Grade I diastolic dysfunction (impaired relaxation).  2. Right ventricular systolic function is normal. The right ventricular  size is normal.  3. Left atrial size was mild to moderately dilated.  4. The mitral valve is normal in structure. No evidence of mitral valve  regurgitation.  5. The aortic valve is grossly normal. Aortic valve regurgitation is not  visualized.   Right shoulder X-Ray: 1. No acute fracture or malalignment. 2. Glenoid spur versus rotator cuff calcification.  Discharge Exam: Blood pressure 124/69, pulse 72, temperature 99.2 F (37.3 C), temperature source Oral, resp. rate 19, height 6' (1.829 m),  weight 104.3 kg, SpO2 93  %.  Disposition: Discharge disposition: 01-Home or Self Care   Discharge Instructions    Diet - low sodium heart healthy   Complete by: As directed    Increase activity slowly   Complete by: As directed      Allergies as of 08/24/2020      Reactions   Other Other (See Comments)      Medication List    STOP taking these medications   omeprazole 40 MG capsule Commonly known as: PRILOSEC     TAKE these medications   aspirin 81 MG EC tablet Take 1 tablet (81 mg total) by mouth daily. Swallow whole. Start taking on: August 25, 2020   atorvastatin 80 MG tablet Commonly known as: LIPITOR Take 1 tablet (80 mg total) by mouth daily. Start taking on: August 25, 2020 What changed:   medication strength  how much to take   Centrum Silver 50+Men Tabs Take 1 tablet by mouth daily.   cholecalciferol 25 MCG (1000 UNIT) tablet Commonly known as: VITAMIN D3 Take 1,000 Units by mouth daily.   clopidogrel 75 MG tablet Commonly known as: PLAVIX Take 1 tablet (75 mg total) by mouth daily. Start taking on: August 25, 2020   irbesartan-hydrochlorothiazide 300-12.5 MG tablet Commonly known as: AVALIDE Take 1 tablet by mouth daily.   isosorbide mononitrate 30 MG 24 hr tablet Commonly known as: IMDUR Take 1 tablet (30 mg total) by mouth daily. Start taking on: August 25, 2020   lidocaine 5 % Commonly known as: LIDODERM Place 1 patch onto the skin daily. Remove & Discard patch within 12 hours or as directed by MD Start taking on: August 25, 2020   metoprolol tartrate 25 MG tablet Commonly known as: LOPRESSOR Take 1 tablet (25 mg total) by mouth 2 (two) times daily.        SignedBonnell Public 08/24/2020, 3:28 PM

## 2020-08-26 ENCOUNTER — Encounter (HOSPITAL_COMMUNITY): Payer: Self-pay | Admitting: Cardiology

## 2020-09-15 ENCOUNTER — Other Ambulatory Visit: Payer: Self-pay | Admitting: Internal Medicine

## 2020-09-19 ENCOUNTER — Other Ambulatory Visit: Payer: Self-pay

## 2020-09-19 ENCOUNTER — Encounter: Payer: Self-pay | Admitting: Cardiology

## 2020-09-19 ENCOUNTER — Ambulatory Visit: Payer: BC Managed Care – PPO | Admitting: Cardiology

## 2020-09-19 VITALS — BP 132/64 | HR 63 | Ht 72.0 in | Wt 242.2 lb

## 2020-09-19 DIAGNOSIS — I2510 Atherosclerotic heart disease of native coronary artery without angina pectoris: Secondary | ICD-10-CM

## 2020-09-19 DIAGNOSIS — E7800 Pure hypercholesterolemia, unspecified: Secondary | ICD-10-CM

## 2020-09-19 DIAGNOSIS — I2583 Coronary atherosclerosis due to lipid rich plaque: Secondary | ICD-10-CM

## 2020-09-19 DIAGNOSIS — I10 Essential (primary) hypertension: Secondary | ICD-10-CM

## 2020-09-19 DIAGNOSIS — I255 Ischemic cardiomyopathy: Secondary | ICD-10-CM | POA: Insufficient documentation

## 2020-09-19 MED ORDER — METOPROLOL SUCCINATE ER 50 MG PO TB24: 50.0000 mg | ORAL_TABLET | Freq: Every day | ORAL | 3 refills | Status: DC

## 2020-09-19 NOTE — Progress Notes (Signed)
Cardiology Office Note:    Date:  09/19/2020   ID:  Lauretta Grill, DOB 1946-03-06, MRN [id number]  PCP:  Jonathon Jordan, MD  Cardiologist:  Fransico Him, MD    Referring MD: Jonathon Jordan, MD   Chief Complaint  Patient presents with  . Coronary Artery Disease  . Hypertension  . Hyperlipidemia  . Cardiomyopathy    History of Present Illness:    Jerome Wilkerson is a 75 y.o. male with a hx of HTN, HLD, GERD and obesity.  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  The right shoulder pain started first and then an hour later started having chest tightness while laying down.  There was associated nausea but no diaphoresis or SOB.   hsTrop elevated at 1013>852.  Cardiac cath was performed showing 20% prox RCA, 50% RPDA, 40% mLAD, occluded pLCx and elevated LVEDP with faint collaterals.  Medical management was recommended and he was discharged on ASA 81mg  daily, high dose statin, Plavix 75mg  daily, Imdur 30mg  daily and Lopressor 25mg  BID.  He is here today for followup and is doing well.  He denies any chest pain or pressure, SOB, DOE, PND, orthopnea, LE edema, dizziness, palpitations or syncope. His only complaint is lack of energy and strength. He is compliant with his meds and is tolerating meds with no SE.    Past Medical History:  Diagnosis Date  . CAD (coronary artery disease), native coronary artery    s/p NSTEMI cath 08/2020 showed 20% prox RCA, 50% RPDA, 40% mLAD, occluded pLCx and elevated LVEDP with faint collaterals.  Medical management recommended.  . Cancer ([place])   . Hyperlipidemia    LDL goal < 70  . Hypertension   . Ischemic cardiomyopathy    EF 50-55% by echo 50-55%.  mid to distal lateral and inferolateral and basal inferior hypokinesis.    Past Surgical History:  Procedure Laterality Date  . LEFT HEART CATH AND CORONARY ANGIOGRAPHY N/A  08/23/2020   Procedure: LEFT HEART CATH AND CORONARY ANGIOGRAPHY;  Surgeon: Martinique, Peter M, MD;  Location: [facility];  Service: Cardiovascular;  Laterality: N/A;    Current Medications: Current Meds  Medication Sig  . aspirin EC 81 MG EC tablet Take 1 tablet (81 mg total) by mouth daily. Swallow whole.  Marland Kitchen atorvastatin (LIPITOR) 80 MG tablet Take 1 tablet (80 mg total) by mouth daily.  . cholecalciferol (VITAMIN D3) 25 MCG (1000 UNIT) tablet Take 1,000 Units by mouth daily.  . clopidogrel (PLAVIX) 75 MG tablet Take 1 tablet (75 mg total) by mouth daily.  . irbesartan-hydrochlorothiazide (AVALIDE) 300-12.5 MG tablet Take 1 tablet by mouth daily.  . isosorbide mononitrate (IMDUR) 30 MG 24 hr tablet Take 1 tablet (30 mg total) by mouth daily.  Marland Kitchen lidocaine (LIDODERM) 5 % Place 1 patch onto the skin daily. Remove & Discard patch within 12 hours or as directed by MD  . metoprolol tartrate (LOPRESSOR) 25 MG tablet Take 1 tablet (25 mg total) by mouth 2 (two) times daily.  . Multiple Vitamins-Minerals (CENTRUM SILVER 50+MEN) TABS Take 1 tablet by mouth daily.     Allergies:   Other   Social History   Socioeconomic History  . Marital status: Married    Spouse name: Not on file  . Number of children: Not on file  . Years of education: Not on  file  . Highest education level: Not on file  Occupational History  . Not on file  Tobacco Use  . Smoking status: Former Research scientist (life sciences)  . Smokeless tobacco: Never Used  Substance and Sexual Activity  . Alcohol use: Not Currently  . Drug use: Never  . Sexual activity: Not on file  Other Topics Concern  . Not on file  Social History Narrative  . Not on file   Social Determinants of Health   Financial Resource Strain: Not on file  Food Insecurity: Not on file  Transportation Needs: Not on file  Physical Activity: Not on file  Stress: Not on file  Social Connections: Not on file     Family History: The patient's family history is not on  file.  ROS:   Please see the history of present illness.    ROS  All other systems reviewed and negative.   EKGs/Labs/Other Studies Reviewed:    The following studies were reviewed today: Cardiac Cath  EKG:  EKG is not ordered today.    Recent Labs: 08/23/2020: BUN 19; Creatinine, Ser 1.17; Potassium 3.9; Sodium 142 08/24/2020: Hemoglobin 14.0; Platelets 133   Recent Lipid Panel    Component Value Date/Time   CHOL 155 08/23/2020 0027   TRIG 73 08/23/2020 0027   HDL 39 (L) 08/23/2020 0027   CHOLHDL 4.0 08/23/2020 0027   VLDL 15 08/23/2020 0027   LDLCALC 101 (H) 08/23/2020 0027    CHA2DS2-VASc Score =   [ ] .  Therefore, the patient's annual risk of stroke is   %.        Physical Exam:    VS:  BP 132/64   Pulse 63   Ht 6' (1.829 m)   Wt 242 lb 3.2 oz (109.9 kg)   SpO2 97%   BMI 32.85 kg/m     Wt Readings from Last 3 Encounters:  09/19/20 242 lb 3.2 oz (109.9 kg)  08/22/20 230 lb (104.3 kg)     GEN:  Well nourished, well developed in no acute distress HEENT: Normal NECK: No JVD; No carotid bruits LYMPHATICS: No lymphadenopathy CARDIAC: RRR, no murmurs, rubs, gallops RESPIRATORY:  Clear to auscultation without rales, wheezing or rhonchi  ABDOMEN: Soft, non-tender, non-distended MUSCULOSKELETAL:  No edema; No deformity  SKIN: Warm and dry NEUROLOGIC:  Alert and oriented x 3 PSYCHIATRIC:  Normal affect   ASSESSMENT:    1. Coronary artery disease due to lipid rich plaque   2. Pure hypercholesterolemia   3. Primary hypertension   4. Ischemic cardiomyopathy    PLAN:    In order of problems listed above:  1.  ASCAD -s/p NSTEMI -cath 08/2020 showed 20% prox RCA, 50% RPDA, 40% mLAD, occluded pLCx and elevated LVEDP with faint collaterals.   -Medical management was recommended  -2D echo showed low normal LVF with EF 50-55% with mid to distal lateral and inferolateral and basal inferior hypokinesis. -he has not had any anginal symptoms since his  discharge -continue ASA, Plavix, BB, long acting nitrate and high dose statin  2.  HLD -LDL goal < 70 -LDL was 101 at the time of his MI -continue atorvastatin 80mg  daily -repeat FLP and ALT   3.  HTN -BP controlled on exam  -continue Lopressor but change to Toprol XL 50mg  daily and Irbesartan HCT  4.  Ischemic DCM -2D echo after NSTEMI with EF 50-55% with mid to distal lateral and inferolateral and basal inferior hypokinesis. -continue Lopressor but change to Toprol XL 50mg  daily and  Irbesartan-HCT 300-12.5mg  daily -check BMET    Medication Adjustments/Labs and Tests Ordered: Current medicines are reviewed at length with the patient today.  Concerns regarding medicines are outlined above.  No orders of the defined types were placed in this encounter.  No orders of the defined types were placed in this encounter.   Signed, Fransico Him, MD  09/19/2020 1:58 PM    [place]

## 2020-09-19 NOTE — Addendum Note (Signed)
Addended by: Antonieta Iba on: 09/19/2020 02:07 PM   Modules accepted: Orders

## 2020-09-19 NOTE — Patient Instructions (Signed)
Medication Instructions:  Your physician has recommended you make the following change in your medication:  1) STOP taking Lopressor (metoprolol tartrate)  2) START taking Toprol XL (metoprolol succinate) 50 mg daily   *If you need a refill on your cardiac medications before your next appointment, please call your pharmacy*   Lab Work: Fasting lipids, CMET and HgbA1C  If you have labs (blood work) drawn today and your tests are completely normal, you will receive your results only by: Marland Kitchen MyChart Message (if you have MyChart) OR . A paper copy in the mail If you have any lab test that is abnormal or we need to change your treatment, we will call you to review the results.  Follow-Up: At [facility], you and your health needs are our priority.  As part of our continuing mission to provide you with exceptional heart care, we have created designated Provider Care Teams.  These Care Teams include your primary Cardiologist (physician) and Advanced Practice Providers (APPs -  Physician Assistants and Nurse Practitioners) who all work together to provide you with the care you need, when you need it.  Your next appointment:   3 month(s)  The format for your next appointment:   In Person  Provider:   You may see Fransico Him, MD or one of the following Advanced Practice Providers on your designated Care Team:    Melina Copa, PA-C  Ermalinda Barrios, PA-C

## 2020-09-20 ENCOUNTER — Telehealth: Payer: Self-pay | Admitting: Cardiology

## 2020-09-20 MED ORDER — ATORVASTATIN CALCIUM 80 MG PO TABS: 80.0000 mg | ORAL_TABLET | Freq: Every day | ORAL | 3 refills | Status: DC

## 2020-09-20 NOTE — Telephone Encounter (Signed)
Pt's medication was sent to pt's pharmacy as requested. Confirmation received.  °

## 2020-09-20 NOTE — Telephone Encounter (Signed)
*  STAT* If patient is at the pharmacy, call can be transferred to refill team.   1. Which medications need to be refilled? (please list name of each medication and dose if known) atorvastatin (LIPITOR) 80 MG tablet  2. Which pharmacy/location (including street and city if local pharmacy) is medication to be sent to? [id number] - [place], [place]  3. Do they need a 30 day or 90 day supply? 90 day   PT is currently out of this medication,and he was seen on 4/28.

## 2020-09-23 ENCOUNTER — Other Ambulatory Visit (INDEPENDENT_AMBULATORY_CARE_PROVIDER_SITE_OTHER): Payer: BC Managed Care – PPO

## 2020-09-23 DIAGNOSIS — I10 Essential (primary) hypertension: Secondary | ICD-10-CM | POA: Diagnosis not present

## 2020-09-23 DIAGNOSIS — I2510 Atherosclerotic heart disease of native coronary artery without angina pectoris: Secondary | ICD-10-CM

## 2020-09-23 DIAGNOSIS — I2583 Coronary atherosclerosis due to lipid rich plaque: Secondary | ICD-10-CM

## 2020-09-23 DIAGNOSIS — E7800 Pure hypercholesterolemia, unspecified: Secondary | ICD-10-CM | POA: Diagnosis not present

## 2020-10-02 ENCOUNTER — Other Ambulatory Visit: Payer: BC Managed Care – PPO | Admitting: *Deleted

## 2020-10-02 ENCOUNTER — Other Ambulatory Visit: Payer: Self-pay

## 2020-10-02 DIAGNOSIS — I255 Ischemic cardiomyopathy: Secondary | ICD-10-CM

## 2020-10-02 DIAGNOSIS — E7800 Pure hypercholesterolemia, unspecified: Secondary | ICD-10-CM

## 2020-10-02 DIAGNOSIS — I10 Essential (primary) hypertension: Secondary | ICD-10-CM

## 2020-10-03 LAB — COMPREHENSIVE METABOLIC PANEL
ALT: 33 IU/L (ref 0–44)
AST: 18 IU/L (ref 0–40)
Albumin/Globulin Ratio: 2 (ref 1.2–2.2)
Albumin: 4.5 g/dL (ref 3.7–4.7)
Alkaline Phosphatase: 78 IU/L (ref 44–121)
BUN/Creatinine Ratio: 17 (ref 10–24)
BUN: 21 mg/dL (ref 8–27)
Bilirubin Total: 0.8 mg/dL (ref 0.0–1.2)
CO2: 25 mmol/L (ref 20–29)
Calcium: 9.8 mg/dL (ref 8.6–10.2)
Chloride: 104 mmol/L (ref 96–106)
Creatinine, Ser: 1.24 mg/dL (ref 0.76–1.27)
Globulin, Total: 2.2 g/dL (ref 1.5–4.5)
Glucose: 111 mg/dL — ABNORMAL HIGH (ref 65–99)
Potassium: 4.6 mmol/L (ref 3.5–5.2)
Sodium: 142 mmol/L (ref 134–144)
Total Protein: 6.7 g/dL (ref 6.0–8.5)
eGFR: 62 mL/min/{1.73_m2} (ref >59)

## 2020-10-03 LAB — HEMOGLOBIN A1C
Est. average glucose Bld gHb Est-mCnc: 123 mg/dL
Hgb A1c MFr Bld: 5.9 % — ABNORMAL HIGH (ref 4.8–5.6)

## 2020-10-03 LAB — LIPID PANEL
Chol/HDL Ratio: 3.2 ratio (ref 0.0–5.0)
Cholesterol, Total: 149 mg/dL (ref 100–199)
HDL: 46 mg/dL (ref >39)
LDL Chol Calc (NIH): 83 mg/dL (ref 0–99)
Triglycerides: 110 mg/dL (ref 0–149)
VLDL Cholesterol Cal: 20 mg/dL (ref 5–40)

## 2020-10-04 ENCOUNTER — Telehealth: Payer: Self-pay

## 2020-10-04 DIAGNOSIS — E7800 Pure hypercholesterolemia, unspecified: Secondary | ICD-10-CM

## 2020-10-04 MED ORDER — EZETIMIBE 10 MG PO TABS: 10.0000 mg | ORAL_TABLET | Freq: Every day | ORAL | 3 refills | Status: DC

## 2020-10-04 NOTE — Telephone Encounter (Signed)
The patient has been notified of the result and verbalized understanding.  All questions (if any) were answered. Antonieta Iba, RN 10/04/2020 3:53 PM  Rx has been sent in for zetia 10 mg daily and repeat lab work has been scheduled.

## 2020-10-04 NOTE — Telephone Encounter (Signed)
-----   Message from Sueanne Margarita, MD sent at 10/03/2020 10:56 AM EDT ----- LDL not at goal - add Zetia 10mg  daily and check FLP and ALT In 6 weeks.  ELevated HbA1C - forward to PCP to review

## 2020-10-09 ENCOUNTER — Other Ambulatory Visit: Payer: Self-pay | Admitting: *Deleted

## 2020-10-09 MED ORDER — ASPIRIN 81 MG PO TBEC: 81.0000 mg | DELAYED_RELEASE_TABLET | Freq: Every day | ORAL | 11 refills | Status: DC

## 2020-10-09 MED ORDER — ISOSORBIDE MONONITRATE ER 30 MG PO TB24: 30.0000 mg | ORAL_TABLET | Freq: Every day | ORAL | 1 refills | Status: DC

## 2020-10-09 MED ORDER — CLOPIDOGREL BISULFATE 75 MG PO TABS: 75.0000 mg | ORAL_TABLET | Freq: Every day | ORAL | 1 refills | Status: DC

## 2020-10-09 MED ORDER — METOPROLOL SUCCINATE ER 50 MG PO TB24: 50.0000 mg | ORAL_TABLET | Freq: Every day | ORAL | 3 refills | Status: DC

## 2020-10-09 MED ORDER — ATORVASTATIN CALCIUM 80 MG PO TABS: 80.0000 mg | ORAL_TABLET | Freq: Every day | ORAL | 3 refills | Status: DC

## 2020-10-14 ENCOUNTER — Other Ambulatory Visit: Payer: Self-pay | Admitting: Internal Medicine

## 2020-10-15 ENCOUNTER — Other Ambulatory Visit: Payer: Self-pay

## 2020-10-15 MED ORDER — ISOSORBIDE MONONITRATE ER 30 MG PO TB24: 30.0000 mg | ORAL_TABLET | Freq: Every day | ORAL | 3 refills | Status: DC

## 2020-10-15 MED ORDER — CLOPIDOGREL BISULFATE 75 MG PO TABS: 75.0000 mg | ORAL_TABLET | Freq: Every day | ORAL | 3 refills | Status: DC

## 2020-10-15 MED ORDER — ASPIRIN 81 MG PO TBEC: 81.0000 mg | DELAYED_RELEASE_TABLET | Freq: Every day | ORAL | 3 refills | Status: AC

## 2020-10-31 ENCOUNTER — Other Ambulatory Visit: Payer: Self-pay

## 2020-10-31 MED ORDER — EZETIMIBE 10 MG PO TABS: 10.0000 mg | ORAL_TABLET | Freq: Every day | ORAL | 3 refills | Status: DC

## 2020-11-19 ENCOUNTER — Other Ambulatory Visit: Payer: Self-pay

## 2020-11-19 ENCOUNTER — Other Ambulatory Visit: Payer: BC Managed Care – PPO | Admitting: *Deleted

## 2020-11-19 DIAGNOSIS — E7800 Pure hypercholesterolemia, unspecified: Secondary | ICD-10-CM

## 2020-11-19 LAB — LIPID PANEL
Chol/HDL Ratio: 2.6 ratio (ref 0.0–5.0)
Cholesterol, Total: 127 mg/dL (ref 100–199)
HDL: 48 mg/dL (ref >39)
LDL Chol Calc (NIH): 63 mg/dL (ref 0–99)
Triglycerides: 82 mg/dL (ref 0–149)
VLDL Cholesterol Cal: 16 mg/dL (ref 5–40)

## 2020-11-19 LAB — ALT: ALT: 39 IU/L (ref 0–44)

## 2020-12-13 ENCOUNTER — Ambulatory Visit: Payer: BC Managed Care – PPO | Admitting: Cardiology

## 2021-01-03 ENCOUNTER — Other Ambulatory Visit: Payer: Self-pay | Admitting: *Deleted

## 2021-01-03 MED ORDER — ATORVASTATIN CALCIUM 80 MG PO TABS: 80.0000 mg | ORAL_TABLET | Freq: Every day | ORAL | 0 refills | Status: DC

## 2021-03-18 ENCOUNTER — Other Ambulatory Visit: Payer: Self-pay | Admitting: Cardiology

## 2021-03-20 ENCOUNTER — Encounter: Payer: Self-pay | Admitting: Cardiology

## 2021-03-20 ENCOUNTER — Ambulatory Visit: Payer: BC Managed Care – PPO | Admitting: Cardiology

## 2021-03-20 ENCOUNTER — Other Ambulatory Visit: Payer: Self-pay

## 2021-03-20 VITALS — BP 130/62 | HR 58 | Ht 71.0 in | Wt 247.0 lb

## 2021-03-20 DIAGNOSIS — E7800 Pure hypercholesterolemia, unspecified: Secondary | ICD-10-CM | POA: Diagnosis not present

## 2021-03-20 DIAGNOSIS — I2510 Atherosclerotic heart disease of native coronary artery without angina pectoris: Secondary | ICD-10-CM

## 2021-03-20 DIAGNOSIS — I255 Ischemic cardiomyopathy: Secondary | ICD-10-CM

## 2021-03-20 DIAGNOSIS — I10 Essential (primary) hypertension: Secondary | ICD-10-CM

## 2021-03-20 DIAGNOSIS — I2583 Coronary atherosclerosis due to lipid rich plaque: Secondary | ICD-10-CM

## 2021-03-20 MED ORDER — CLOPIDOGREL BISULFATE 75 MG PO TABS: 75.0000 mg | ORAL_TABLET | Freq: Every day | ORAL | 3 refills | Status: AC

## 2021-03-20 MED ORDER — EZETIMIBE 10 MG PO TABS: 10.0000 mg | ORAL_TABLET | Freq: Every day | ORAL | 3 refills | Status: DC

## 2021-03-20 MED ORDER — ISOSORBIDE MONONITRATE ER 30 MG PO TB24: 30.0000 mg | ORAL_TABLET | Freq: Every day | ORAL | 3 refills | Status: AC

## 2021-03-20 MED ORDER — ATORVASTATIN CALCIUM 80 MG PO TABS: 80.0000 mg | ORAL_TABLET | Freq: Every day | ORAL | 3 refills | Status: AC

## 2021-03-20 MED ORDER — METOPROLOL SUCCINATE ER 50 MG PO TB24: 50.0000 mg | ORAL_TABLET | Freq: Every day | ORAL | 3 refills | Status: AC

## 2021-03-20 NOTE — Addendum Note (Signed)
Addended by: Carylon Perches on: 03/20/2021 09:00 AM   Modules accepted: Orders

## 2021-03-20 NOTE — Progress Notes (Signed)
Cardiology Office Note:    Date:  03/20/2021   ID:  Jerome Wilkerson, DOB 05-12-1946, MRN [id number]  PCP:  Jonathon Jordan, MD  Cardiologist:  Fransico Him, MD    Referring MD: Jonathon Jordan, MD   Chief Complaint  Patient presents with   Coronary Artery Disease   Hyperlipidemia   Hypertension   Cardiomyopathy     History of Present Illness:    Jerome Wilkerson is a 75 y.o. male with a hx of HTN, HLD, GERD and obesity.  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  The right shoulder pain started first and then an hour later started having chest tightness while laying down.  There was associated nausea but no diaphoresis or SOB.   hsTrop elevated at 1013>852.  Cardiac cath was performed showing 20% prox RCA, 50% RPDA, 40% mLAD, occluded pLCx and elevated LVEDP with faint collaterals.  Medical management was recommended and he was discharged on ASA 81mg  daily, high dose statin, Plavix 75mg  daily, Imdur 30mg  daily and Lopressor 25mg  BID.  He is here today for followup and is doing well.  He denies any chest pain or pressure, SOB, DOE, PND, orthopnea, LE edema, dizziness, palpitations or syncope. He is compliant with his meds and is tolerating meds with no SE.     Past Medical History:  Diagnosis Date   CAD (coronary artery disease), native coronary artery    s/p NSTEMI cath 08/2020 showed 20% prox RCA, 50% RPDA, 40% mLAD, occluded pLCx and elevated LVEDP with faint collaterals.  Medical management recommended.   Cancer ([place])    Hyperlipidemia    LDL goal < 70   Hypertension    Ischemic cardiomyopathy    EF 50-55% by echo 50-55%.  mid to distal lateral and inferolateral and basal inferior hypokinesis.    Past Surgical History:  Procedure Laterality Date   LEFT HEART CATH AND CORONARY ANGIOGRAPHY N/A 08/23/2020   Procedure: LEFT HEART CATH AND CORONARY  ANGIOGRAPHY;  Surgeon: Martinique, Peter M, MD;  Location: [facility];  Service: Cardiovascular;  Laterality: N/A;    Current Medications: Current Meds  Medication Sig   aspirin 81 MG EC tablet Take 1 tablet (81 mg total) by mouth daily. Swallow whole.   atorvastatin (LIPITOR) 80 MG tablet TAKE 1 TABLET DAILY   cholecalciferol (VITAMIN D3) 25 MCG (1000 UNIT) tablet Take 1,000 Units by mouth daily.   clopidogrel (PLAVIX) 75 MG tablet Take 1 tablet (75 mg total) by mouth daily.   ezetimibe (ZETIA) 10 MG tablet Take 1 tablet (10 mg total) by mouth daily.   irbesartan-hydrochlorothiazide (AVALIDE) 300-12.5 MG tablet Take 1 tablet by mouth daily.   isosorbide mononitrate (IMDUR) 30 MG 24 hr tablet Take 1 tablet (30 mg total) by mouth daily.   metoprolol succinate (TOPROL-XL) 50 MG 24 hr tablet Take 1 tablet (50 mg total) by mouth daily. Take with or immediately following a meal.   Multiple Vitamins-Minerals (CENTRUM SILVER 50+MEN) TABS Take 1 tablet by mouth daily.     Allergies:   Other   Social History   Socioeconomic History   Marital status: Married    Spouse name: Not on file   Number of children: Not on file   Years of education: Not on file   Highest education level: Not on file  Occupational History   Not on  file  Tobacco Use   Smoking status: Former   Smokeless tobacco: Never  Substance and Sexual Activity   Alcohol use: Not Currently   Drug use: Never   Sexual activity: Not on file  Other Topics Concern   Not on file  Social History Narrative   Not on file   Social Determinants of Health   Financial Resource Strain: Not on file  Food Insecurity: Not on file  Transportation Needs: Not on file  Physical Activity: Not on file  Stress: Not on file  Social Connections: Not on file     Family History: The patient's family history includes Healthy in his mother; Hypertension in his sister; Lung cancer in his father.  ROS:   Please see the history of present  illness.    ROS  All other systems reviewed and negative.   EKGs/Labs/Other Studies Reviewed:    The following studies were reviewed today: Cardiac Cath  EKG:  EKG is not ordered today.    Recent Labs: 08/24/2020: Hemoglobin 14.0; Platelets 133 10/02/2020: BUN 21; Creatinine, Ser 1.24; Potassium 4.6; Sodium 142 11/19/2020: ALT 39   Recent Lipid Panel    Component Value Date/Time   CHOL 127 11/19/2020 0735   TRIG 82 11/19/2020 0735   HDL 48 11/19/2020 0735   CHOLHDL 2.6 11/19/2020 0735   CHOLHDL 4.0 08/23/2020 0027   VLDL 15 08/23/2020 0027   LDLCALC 63 11/19/2020 0735    Physical Exam:    VS:  BP 130/62   Pulse (!) 58   Ht 5\' 11"  (1.803 m)   Wt 247 lb (112 kg)   SpO2 98%   BMI 34.45 kg/m     Wt Readings from Last 3 Encounters:  03/20/21 247 lb (112 kg)  09/19/20 242 lb 3.2 oz (109.9 kg)  08/22/20 230 lb (104.3 kg)   GEN: Well nourished, well developed in no acute distress HEENT: Normal NECK: No JVD; No carotid bruits LYMPHATICS: No lymphadenopathy CARDIAC:RRR, no murmurs, rubs, gallops RESPIRATORY:  Clear to auscultation without rales, wheezing or rhonchi  ABDOMEN: Soft, non-tender, non-distended MUSCULOSKELETAL:  No edema; No deformity  SKIN: Warm and dry NEUROLOGIC:  Alert and oriented x 3 PSYCHIATRIC:  Normal affect   ASSESSMENT:    1. Coronary artery disease due to lipid rich plaque   2. Pure hypercholesterolemia   3. Primary hypertension   4. Ischemic cardiomyopathy    PLAN:    In order of problems listed above:  1.  ASCAD -s/p NSTEMI -cath 08/2020 showed 20% prox RCA, 50% RPDA, 40% mLAD, occluded pLCx and elevated LVEDP with faint collaterals.   -Medical management was recommended  -2D echo showed low normal LVF with EF 50-55% with mid to distal lateral and inferolateral and basal inferior hypokinesis. -He denies any anginal symptoms since I saw him last -He will continue prescription drug management with aspirin 81 mg daily Plavix 75 mg daily,  Toprol-XL 50 mg daily, Imdur 30 mg daily and statin-refilled today   2.  HLD -LDL goal < 70 -I have personally reviewed and interpreted outside labs performed by patient's PCP which showed LDL 63, HDL 48, triglycerides 82, total cholesterol 127 on 11/19/2020 -He will continue prescription drug management with atorvastatin 80 mg daily and Zetia 10 mg daily-refilled today  3.  HTN -BP is well controlled on exam today -Continue prescription drug management with irbesartan-HCT 300-12.5 mg daily, Toprol-XL 50 mg daily-these were refilled today -I have personally reviewed and interpreted outside labs performed by patient's PCP  which showed serum creatinine 1.24, BUN 21 on 10/02/2020  4.  Ischemic DCM -2D echo after NSTEMI with EF 50-55% with mid to distal lateral and inferolateral and basal inferior hypokinesis. -Continue Toprol-XL 50 mg daily and irbesartan    Medication Adjustments/Labs and Tests Ordered: Current medicines are reviewed at length with the patient today.  Concerns regarding medicines are outlined above.  No orders of the defined types were placed in this encounter.  No orders of the defined types were placed in this encounter.   Signed, Fransico Him, MD  03/20/2021 8:53 AM    [place]

## 2021-03-20 NOTE — Patient Instructions (Signed)
Medication Instructions:  Your physician recommends that you continue on your current medications as directed. Please refer to the Current Medication list given to you today.  *If you need a refill on your cardiac medications before your next appointment, please call your pharmacy*   Lab Work: None If you have labs (blood work) drawn today and your tests are completely normal, you will receive your results only by: [place] (if you have MyChart) OR A paper copy in the mail If you have any lab test that is abnormal or we need to change your treatment, we will call you to review the results.   Follow-Up: At [facility], you and your health needs are our priority.  As part of our continuing mission to provide you with exceptional heart care, we have created designated Provider Care Teams.  These Care Teams include your primary Cardiologist (physician) and Advanced Practice Providers (APPs -  Physician Assistants and Nurse Practitioners) who all work together to provide you with the care you need, when you need it.   Your next appointment:   1 year(s)  The format for your next appointment:   In Person  Provider:   You may see Fransico Him, MD or one of the following Advanced Practice Providers on your designated Care Team:   Melina Copa, PA-C Ermalinda Barrios, PA-C

## 2021-08-01 ENCOUNTER — Other Ambulatory Visit: Payer: Self-pay

## 2021-08-01 ENCOUNTER — Ambulatory Visit (HOSPITAL_BASED_OUTPATIENT_CLINIC_OR_DEPARTMENT_OTHER)
Admission: RE | Admit: 2021-08-01 | Discharge: 2021-08-01 | Disposition: A | Discharge status: Home / Self Care | Payer: BC Managed Care – PPO | Source: Ambulatory Visit | Attending: Family Medicine | Admitting: Family Medicine

## 2021-08-01 ENCOUNTER — Other Ambulatory Visit (HOSPITAL_BASED_OUTPATIENT_CLINIC_OR_DEPARTMENT_OTHER): Payer: Self-pay | Admitting: Family Medicine

## 2021-08-01 DIAGNOSIS — M7989 Other specified soft tissue disorders: Secondary | ICD-10-CM

## 2021-11-20 ENCOUNTER — Other Ambulatory Visit: Payer: Self-pay | Admitting: Cardiology

## 2022-03-25 ENCOUNTER — Other Ambulatory Visit: Payer: Self-pay | Admitting: Cardiology

## 2023-06-25 IMAGING — US US EXTREM LOW VENOUS*L*
1 series · 13 of 24 positions shown · non-contrast
Comparison: None.

CLINICAL DATA: Left lower extremity edema.  Evaluate for DVT.



[Series 1: us venous img lower uni left (dvt) · portal-venous · 13 of 45 slices shown]
[im 1/45]
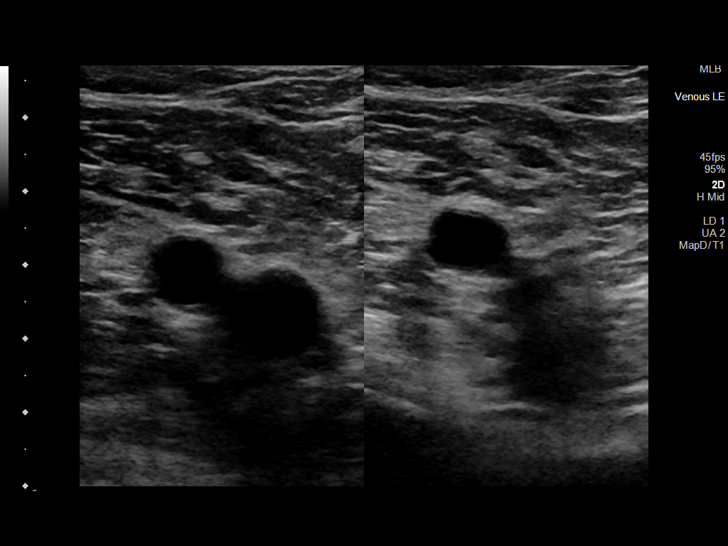
[im 4/45]
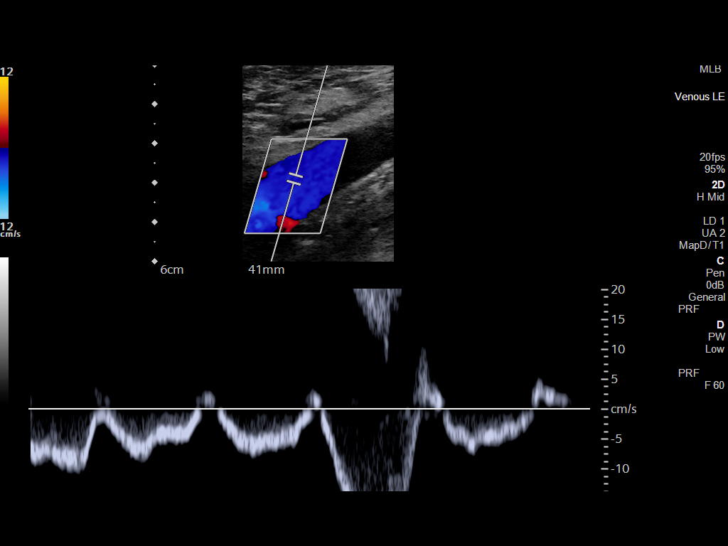
[im 8/45]
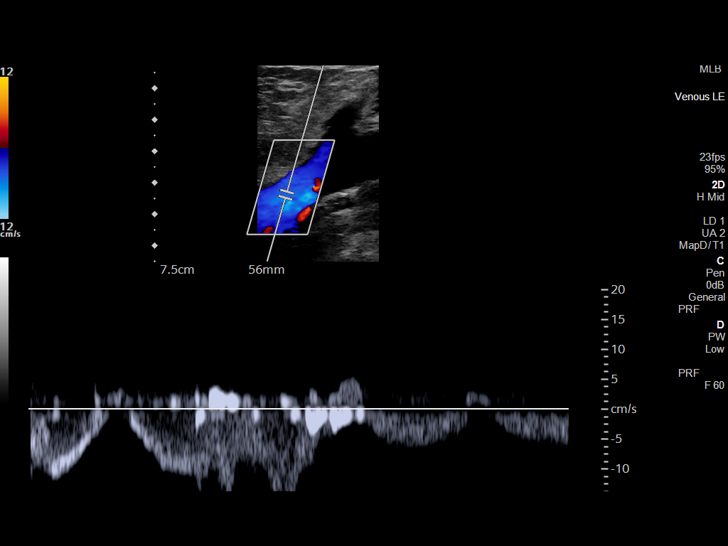
[im 12/45]
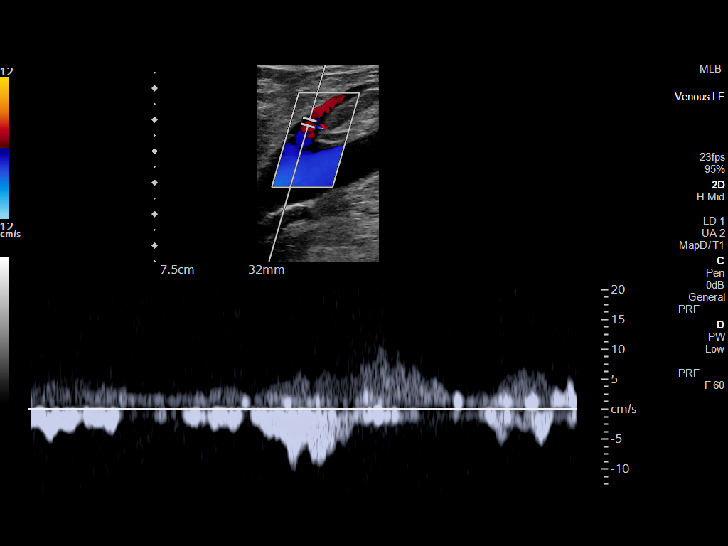
[im 16/45]
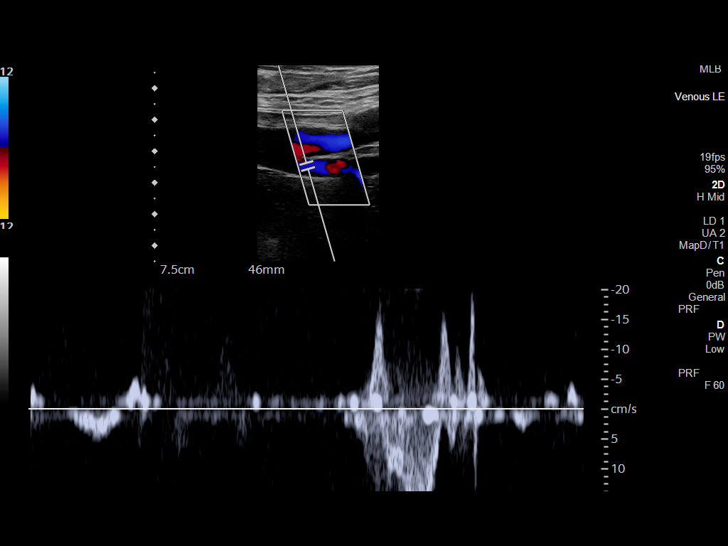
[im 20/45]
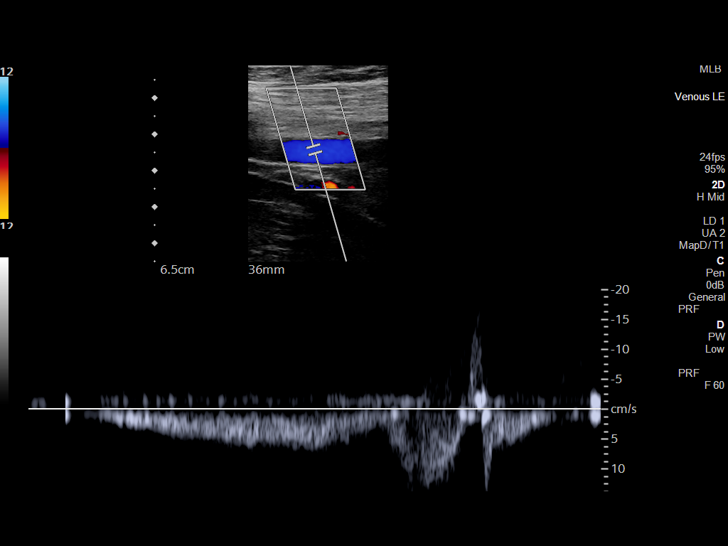
[im 23/45]
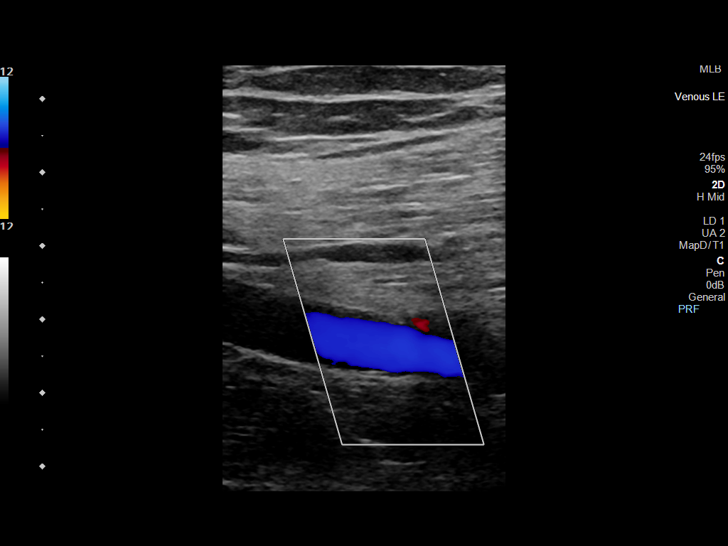
[im 25/45]
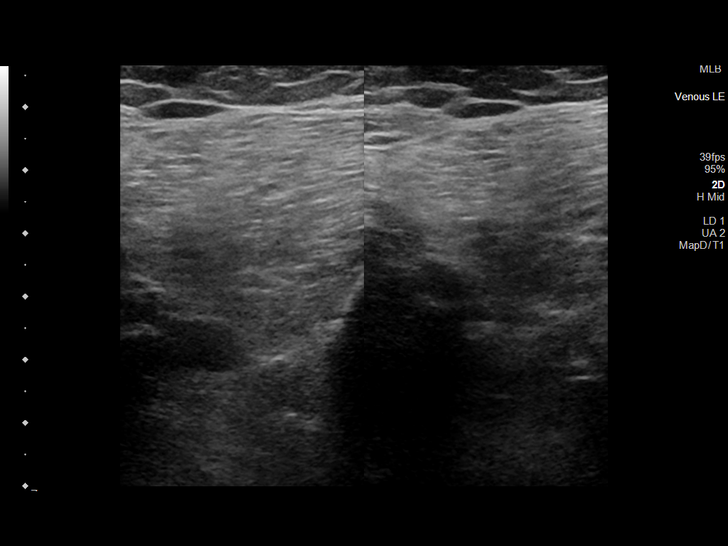
[im 29/45]
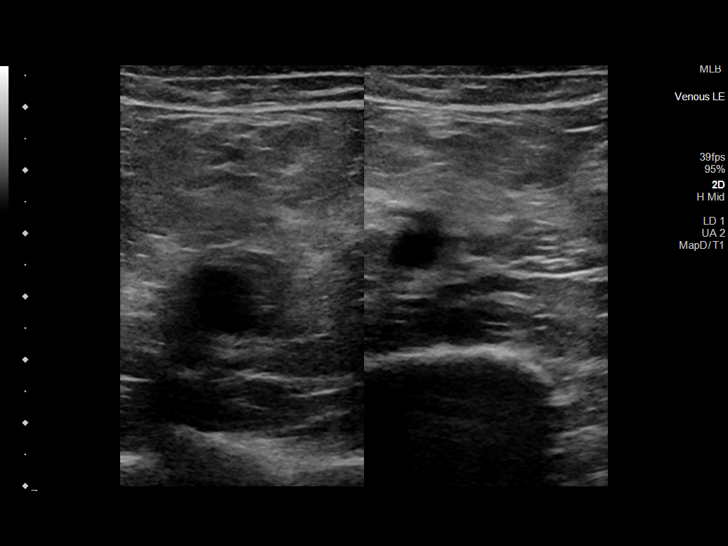
[im 33/45]
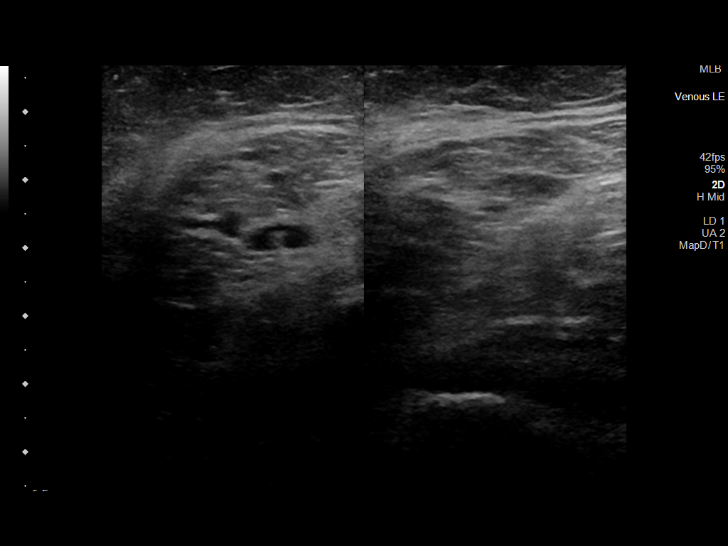
[im 37/45]
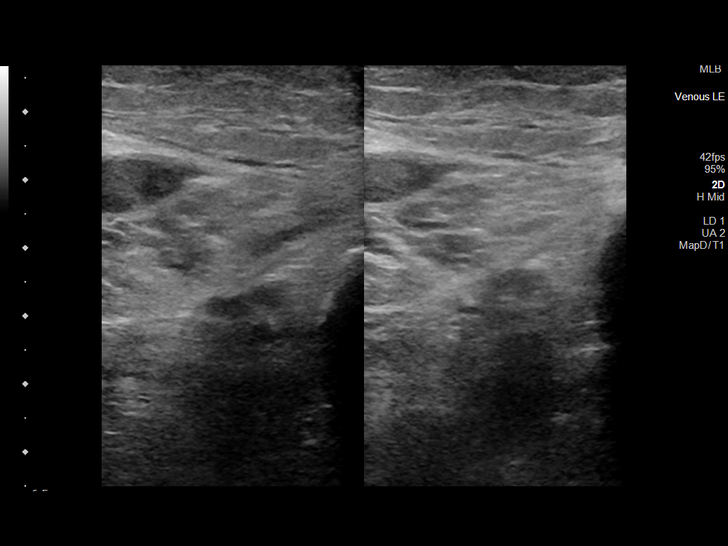
[im 41/45]
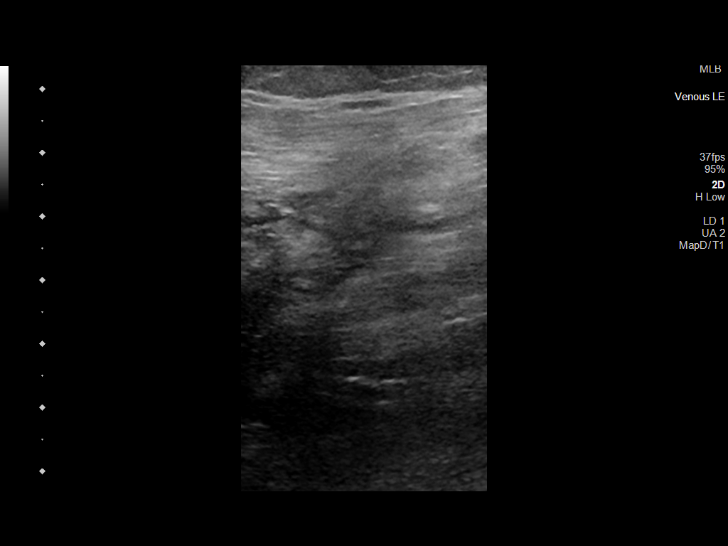
[im 45/45]
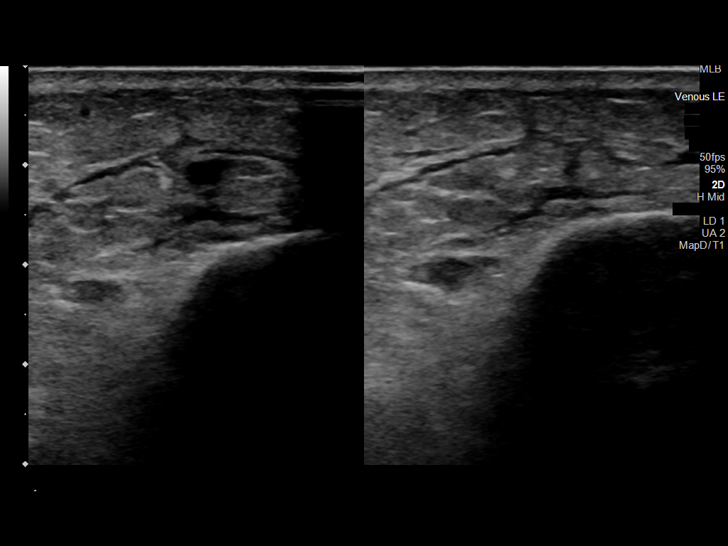

[13 of 24 positions shown; findings below may reference images not displayed]

FINDINGS: Contralateral Common Femoral Vein: Respiratory phasicity is normal
and symmetric with the symptomatic side. No evidence of thrombus.
Normal compressibility.

Common Femoral Vein: No evidence of thrombus. Normal
compressibility, respiratory phasicity and response to augmentation.

Saphenofemoral Junction: No evidence of thrombus. Normal
compressibility and flow on color Doppler imaging.

Profunda Femoral Vein: No evidence of thrombus. Normal
compressibility and flow on color Doppler imaging.

Femoral Vein: No evidence of thrombus. Normal compressibility,
respiratory phasicity and response to augmentation.

Popliteal Vein: No evidence of thrombus. Normal compressibility,
respiratory phasicity and response to augmentation.

Calf Veins: No evidence of thrombus. Normal compressibility and flow
on color Doppler imaging.

Superficial Great Saphenous Vein: No evidence of thrombus. Normal
compressibility.

Venous Reflux:  None.

Other Findings:  None.
IMPRESSION: No evidence of DVT within the left lower extremity.
